# Patient Record
Sex: Female | Born: 1945 | Race: White | Hispanic: No | Marital: Married | Smoking: Never smoker
Health system: Southern US, Community
[De-identification: ages and names within clinical notes are randomized; demographics above are authoritative.]

## PROBLEM LIST (undated history)

## (undated) DIAGNOSIS — M1990 Unspecified osteoarthritis, unspecified site: Secondary | ICD-10-CM

## (undated) DIAGNOSIS — F419 Anxiety disorder, unspecified: Secondary | ICD-10-CM

## (undated) DIAGNOSIS — M47812 Spondylosis without myelopathy or radiculopathy, cervical region: Secondary | ICD-10-CM

## (undated) DIAGNOSIS — F32A Depression, unspecified: Secondary | ICD-10-CM

## (undated) DIAGNOSIS — I10 Essential (primary) hypertension: Secondary | ICD-10-CM

## (undated) HISTORY — DX: Anxiety disorder, unspecified: F41.9

## (undated) HISTORY — DX: Essential (primary) hypertension: I10

## (undated) HISTORY — PX: ABDOMINAL HYSTERECTOMY: SHX81

## (undated) HISTORY — PX: JOINT REPLACEMENT: SHX530

## (undated) HISTORY — PX: HAND SURGERY: SHX662

## (undated) HISTORY — DX: Depression, unspecified: F32.A

---

## 2012-01-07 DIAGNOSIS — M545 Low back pain, unspecified: Secondary | ICD-10-CM | POA: Insufficient documentation

## 2012-01-07 DIAGNOSIS — E669 Obesity, unspecified: Secondary | ICD-10-CM | POA: Insufficient documentation

## 2012-01-07 DIAGNOSIS — G8929 Other chronic pain: Secondary | ICD-10-CM | POA: Insufficient documentation

## 2013-07-07 DIAGNOSIS — M1710 Unilateral primary osteoarthritis, unspecified knee: Secondary | ICD-10-CM | POA: Insufficient documentation

## 2014-01-03 DIAGNOSIS — M47812 Spondylosis without myelopathy or radiculopathy, cervical region: Secondary | ICD-10-CM | POA: Insufficient documentation

## 2016-02-12 DIAGNOSIS — M1811 Unilateral primary osteoarthritis of first carpometacarpal joint, right hand: Secondary | ICD-10-CM | POA: Insufficient documentation

## 2016-05-21 DIAGNOSIS — H02834 Dermatochalasis of left upper eyelid: Secondary | ICD-10-CM | POA: Insufficient documentation

## 2016-05-21 DIAGNOSIS — H02135 Senile ectropion of left lower eyelid: Secondary | ICD-10-CM | POA: Insufficient documentation

## 2016-05-21 DIAGNOSIS — H02132 Senile ectropion of right lower eyelid: Secondary | ICD-10-CM | POA: Insufficient documentation

## 2016-05-21 DIAGNOSIS — H02831 Dermatochalasis of right upper eyelid: Secondary | ICD-10-CM | POA: Insufficient documentation

## 2017-02-05 DIAGNOSIS — Z1211 Encounter for screening for malignant neoplasm of colon: Secondary | ICD-10-CM | POA: Insufficient documentation

## 2017-09-21 DIAGNOSIS — F325 Major depressive disorder, single episode, in full remission: Secondary | ICD-10-CM | POA: Insufficient documentation

## 2018-07-05 DIAGNOSIS — Z96643 Presence of artificial hip joint, bilateral: Secondary | ICD-10-CM | POA: Insufficient documentation

## 2018-07-05 DIAGNOSIS — Z96651 Presence of right artificial knee joint: Secondary | ICD-10-CM | POA: Insufficient documentation

## 2018-07-05 DIAGNOSIS — Z96641 Presence of right artificial hip joint: Secondary | ICD-10-CM | POA: Insufficient documentation

## 2018-07-05 DIAGNOSIS — Z96642 Presence of left artificial hip joint: Secondary | ICD-10-CM | POA: Insufficient documentation

## 2018-07-05 DIAGNOSIS — Z96653 Presence of artificial knee joint, bilateral: Secondary | ICD-10-CM | POA: Insufficient documentation

## 2018-07-05 DIAGNOSIS — Z09 Encounter for follow-up examination after completed treatment for conditions other than malignant neoplasm: Secondary | ICD-10-CM | POA: Insufficient documentation

## 2018-07-09 DIAGNOSIS — M47816 Spondylosis without myelopathy or radiculopathy, lumbar region: Secondary | ICD-10-CM | POA: Insufficient documentation

## 2018-12-29 DIAGNOSIS — M1990 Unspecified osteoarthritis, unspecified site: Secondary | ICD-10-CM | POA: Insufficient documentation

## 2019-06-07 DIAGNOSIS — M1712 Unilateral primary osteoarthritis, left knee: Secondary | ICD-10-CM | POA: Insufficient documentation

## 2020-04-05 DIAGNOSIS — Z6839 Body mass index (BMI) 39.0-39.9, adult: Secondary | ICD-10-CM | POA: Insufficient documentation

## 2020-05-16 DIAGNOSIS — M4807 Spinal stenosis, lumbosacral region: Secondary | ICD-10-CM | POA: Insufficient documentation

## 2020-06-07 DIAGNOSIS — G8929 Other chronic pain: Secondary | ICD-10-CM | POA: Insufficient documentation

## 2020-07-24 DIAGNOSIS — F10982 Alcohol use, unspecified with alcohol-induced sleep disorder: Secondary | ICD-10-CM | POA: Insufficient documentation

## 2020-07-24 DIAGNOSIS — F4542 Pain disorder with related psychological factors: Secondary | ICD-10-CM | POA: Insufficient documentation

## 2021-01-08 DIAGNOSIS — R519 Headache, unspecified: Secondary | ICD-10-CM | POA: Diagnosis present

## 2021-01-08 DIAGNOSIS — E860 Dehydration: Secondary | ICD-10-CM | POA: Insufficient documentation

## 2021-01-08 DIAGNOSIS — U071 COVID-19: Secondary | ICD-10-CM | POA: Insufficient documentation

## 2021-01-08 DIAGNOSIS — R55 Syncope and collapse: Secondary | ICD-10-CM | POA: Insufficient documentation

## 2021-01-08 NOTE — ED Triage Notes (Signed)
Pt in with co syncopal episode tonight hx of the same in the past 2 weeks. Family has recently tested positive for covid. Also had n.v.d worsened today.

## 2021-01-09 ENCOUNTER — Emergency Department
Admission: EM | Admit: 2021-01-09 | Discharge: 2021-01-09 | Disposition: A | Discharge status: Home / Self Care | Payer: Medicare HMO | Attending: Emergency Medicine | Admitting: Emergency Medicine

## 2021-01-09 ENCOUNTER — Emergency Department: Payer: Medicare HMO

## 2021-01-09 ENCOUNTER — Other Ambulatory Visit: Payer: Self-pay

## 2021-01-09 DIAGNOSIS — E860 Dehydration: Secondary | ICD-10-CM

## 2021-01-09 DIAGNOSIS — R55 Syncope and collapse: Secondary | ICD-10-CM

## 2021-01-09 DIAGNOSIS — U071 COVID-19: Secondary | ICD-10-CM

## 2021-01-09 LAB — COMPREHENSIVE METABOLIC PANEL
ALT: 14 U/L (ref 0–44)
AST: 19 U/L (ref 15–41)
Albumin: 4 g/dL (ref 3.5–5.0)
Alkaline Phosphatase: 84 U/L (ref 38–126)
Anion gap: 12 (ref 5–15)
BUN: 29 mg/dL — ABNORMAL HIGH (ref 8–23)
CO2: 23 mmol/L (ref 22–32)
Calcium: 8.9 mg/dL (ref 8.9–10.3)
Chloride: 101 mmol/L (ref 98–111)
Creatinine, Ser: 1.6 mg/dL — ABNORMAL HIGH (ref 0.44–1.00)
GFR, Estimated: 33 mL/min — ABNORMAL LOW (ref >60)
Glucose, Bld: 118 mg/dL — ABNORMAL HIGH (ref 70–99)
Potassium: 3.9 mmol/L (ref 3.5–5.1)
Sodium: 136 mmol/L (ref 135–145)
Total Bilirubin: 1.1 mg/dL (ref 0.3–1.2)
Total Protein: 7.4 g/dL (ref 6.5–8.1)

## 2021-01-09 LAB — TROPONIN I (HIGH SENSITIVITY)
Troponin I (High Sensitivity): 5 ng/L (ref <18)
Troponin I (High Sensitivity): 6 ng/L (ref <18)

## 2021-01-09 LAB — CBC
HCT: 40.5 % (ref 36.0–46.0)
Hemoglobin: 13.3 g/dL (ref 12.0–15.0)
MCH: 30 pg (ref 26.0–34.0)
MCHC: 32.8 g/dL (ref 30.0–36.0)
MCV: 91.2 fL (ref 80.0–100.0)
Platelets: 176 10*3/uL (ref 150–400)
RBC: 4.44 MIL/uL (ref 3.87–5.11)
RDW: 12.2 % (ref 11.5–15.5)
WBC: 6.8 10*3/uL (ref 4.0–10.5)
nRBC: 0 % (ref 0.0–0.2)

## 2021-01-09 LAB — RESP PANEL BY RT-PCR (FLU A&B, COVID) ARPGX2
Influenza A by PCR: NEGATIVE
Influenza B by PCR: NEGATIVE
SARS Coronavirus 2 by RT PCR: POSITIVE — AB

## 2021-01-09 MED ORDER — ONDANSETRON 4 MG PO TBDP: 4.0000 mg | ORAL_TABLET | Freq: Three times a day (TID) | ORAL | 0 refills | Status: DC | PRN

## 2021-01-09 MED ORDER — LACTATED RINGERS IV BOLUS
1000.0000 mL | Freq: Once | INTRAVENOUS | Status: AC
  Administered 2021-01-09: 1000 mL via INTRAVENOUS

## 2021-01-09 NOTE — ED Provider Notes (Signed)
[facility] Emergency Department Provider Note   ____________________________________________   Event Date/Time   First MD Initiated Contact with Patient 01/09/21 [phone]     (approximate)  I have reviewed the triage vital signs and the nursing notes.   HISTORY  Chief Complaint Loss of Consciousness    HPI Grace Zimmerman is a 75 y.o. female with a stated past recent diagnosis of COVID who presents after a syncopal episode that was witnessed earlier tonight.  Patient states she has had similar episodes in the past 2 weeks that she believes is secondary to nausea/vomiting/diarrhea has worsened beginning yesterday.  Patient denies any exacerbating or relieving factors.  Patient states that she feels significantly lightheaded when rising from a seated position or getting up from laying down.         No past medical history on file.  There are no problems to display for this patient.   Prior to Admission medications   Medication Sig Start Date End Date Taking? Authorizing Provider  ondansetron (ZOFRAN ODT) 4 MG disintegrating tablet Take 1 tablet (4 mg total) by mouth every 8 (eight) hours as needed for nausea or vomiting. 01/09/21  Yes Naaman Plummer, MD    Allergies Patient has no allergy information on record.  No family history on file.  Social History    Review of Systems Constitutional: No fever/chills Eyes: No visual changes. ENT: No sore throat. Cardiovascular: Denies chest pain. Respiratory: Denies shortness of breath. Gastrointestinal: No abdominal pain.  Endorses nausea/vomiting/diarrhea Genitourinary: Negative for dysuria. Musculoskeletal: Negative for acute arthralgias Skin: Negative for rash. Neurological: Negative for headaches, weakness/numbness/paresthesias in any extremity Psychiatric: Negative for suicidal ideation/homicidal ideation   ____________________________________________   PHYSICAL EXAM:  VITAL SIGNS: ED  Triage Vitals  Enc Vitals Group     BP 01/08/21 2358 (!) 109/56     Pulse Rate 01/08/21 2358 68     Resp 01/08/21 2358 20     Temp 01/08/21 2358 98.1 F (36.7 C)     Temp Source 01/08/21 2358 Oral     SpO2 01/08/21 2358 96 %     Weight 01/09/21 0001 223 lb (101.2 kg)     Height 01/09/21 0001 5\' 7"  (1.702 m)     Head Circumference --      Peak Flow --      Pain Score 01/09/21 0001 0     Pain Loc --      Pain Edu? --      Excl. in [place]? --    Constitutional: Alert and oriented. Well appearing and in no acute distress. Eyes: Conjunctivae are normal. PERRL. Head: Atraumatic. Nose: No congestion/rhinnorhea. Mouth/Throat: Mucous membranes are moist. Neck: No stridor Cardiovascular: Grossly normal heart sounds.  Good peripheral circulation. Respiratory: Normal respiratory effort.  No retractions. Gastrointestinal: Soft and nontender. No distention. Musculoskeletal: No obvious deformities Neurologic:  Normal speech and language. No gross focal neurologic deficits are appreciated. Skin:  Skin is warm and dry. No rash noted. Psychiatric: Mood and affect are normal. Speech and behavior are normal.  ____________________________________________   LABS (all labs ordered are listed, but only abnormal results are displayed)  Labs Reviewed  RESP PANEL BY RT-PCR (FLU A&B, COVID) ARPGX2 - Abnormal; Notable for the following components:      Result Value   SARS Coronavirus 2 by RT PCR POSITIVE (*)    All other components within normal limits  COMPREHENSIVE METABOLIC PANEL - Abnormal; Notable for the following components:   Glucose, Bld  118 (*)    BUN 29 (*)    Creatinine, Ser 1.60 (*)    GFR, Estimated 33 (*)    All other components within normal limits  CBC  URINALYSIS, COMPLETE (UACMP) WITH MICROSCOPIC  TROPONIN I (HIGH SENSITIVITY)  TROPONIN I (HIGH SENSITIVITY)   ____________________________________________  EKG  ED ECG REPORT I, Naaman Plummer, the attending physician,  personally viewed and interpreted this ECG.  Date: 01/09/2021 EKG Time: 0346 Rate: 76 Rhythm: normal sinus rhythm QRS Axis: normal Intervals: normal ST/T Wave abnormalities: normal Narrative Interpretation: no evidence of acute ischemia  ____________________________________________  RADIOLOGY  ED MD interpretation: Single view portable x-ray of the chest shows no evidence of acute abnormalities including no pneumonia, pneumothorax, or widened mediastinum  Official radiology report(s): DG Chest 1 View  Result Date: 01/09/2021 CLINICAL DATA:  Syncope EXAM: CHEST  1 VIEW COMPARISON:  None. FINDINGS: The heart size and mediastinal contours are within normal limits. Both lungs are clear. The visualized skeletal structures are unremarkable. IMPRESSION: No active disease. Electronically Signed   By: Constance Holster M.D.   On: 01/09/2021 00:45    ____________________________________________   PROCEDURES  Procedure(s) performed (including Critical Care):  Procedures   ____________________________________________   INITIAL IMPRESSION / ASSESSMENT AND PLAN / ED COURSE  As part of my medical decision making, I reviewed the following data within the [place] notes reviewed and incorporated, Labs reviewed, EKG interpreted, Old chart reviewed, Radiograph reviewed and Notes from prior ED visits reviewed and incorporated        Patient presents with complaints of syncope/presyncope ED Workup:  CBC, BMP, Troponin, BNP, ECG, CXR Differential diagnosis includes HF, ICH, seizure, stroke, HOCM, ACS, aortic dissection, malignant arrhythmia, or GI bleed. Findings: No evidence of acute laboratory abnormalities.  Troponin negative x1 EKG: No e/o STEMI. No evidence of Brugadas sign, delta wave, epsilon wave, significantly prolonged QTc, or malignant arrhythmia.  Disposition: Discharge. Patient is at baseline at this time. Return precautions expressed and  understood in person. Advised follow up with primary care provider or clinic physician in next 24 hours.      ____________________________________________   FINAL CLINICAL IMPRESSION(S) / ED DIAGNOSES  Final diagnoses:  Dehydration  Syncope and collapse  COVID-19 virus infection     ED Discharge Orders         Ordered    ondansetron (ZOFRAN ODT) 4 MG disintegrating tablet  Every 8 hours PRN        01/09/21 0939           Note:  This document was prepared using Dragon voice recognition software and may include unintentional dictation errors.   Naaman Plummer, MD 01/09/21 [phone]

## 2021-01-09 NOTE — ED Notes (Signed)
See triage note, pt reports she thinks she has covid, sx started 2 weeks ago. C/o N/V NAD noted, RR even and unlabored

## 2021-01-09 NOTE — ED Notes (Signed)
Pt provided graham crackers and ice water for PO challenge

## 2021-01-09 NOTE — ED Notes (Signed)
Dr bradler notified of positive covid, no new orders at this time

## 2021-01-10 ENCOUNTER — Telehealth: Payer: Self-pay | Admitting: *Deleted

## 2021-01-10 NOTE — Telephone Encounter (Signed)
Called to discuss with patient about COVID-19 symptoms and the use of one of the available treatments for those with mild to moderate Covid symptoms and at a high risk of hospitalization.  Pt appears to qualify for outpatient treatment due to co-morbid conditions and/or a member of an at-risk group in accordance with the FDA Emergency Use Authorization.    Symptom onset:  Vaccinated:  Booster?  Immunocompromised?  Qualifiers:   Unable to reach pt - No answer and unable to leave a VM.  Grace Zimmerman

## 2021-02-26 DIAGNOSIS — Z0000 Encounter for general adult medical examination without abnormal findings: Secondary | ICD-10-CM | POA: Insufficient documentation

## 2021-03-12 DIAGNOSIS — F419 Anxiety disorder, unspecified: Secondary | ICD-10-CM | POA: Insufficient documentation

## 2021-03-12 DIAGNOSIS — K219 Gastro-esophageal reflux disease without esophagitis: Secondary | ICD-10-CM | POA: Insufficient documentation

## 2021-03-12 DIAGNOSIS — E785 Hyperlipidemia, unspecified: Secondary | ICD-10-CM | POA: Insufficient documentation

## 2021-03-12 DIAGNOSIS — I10 Essential (primary) hypertension: Secondary | ICD-10-CM | POA: Insufficient documentation

## 2021-03-12 NOTE — Progress Notes (Signed)
Patient: Grace Zimmerman  Service Category: E/M  Provider: Gaspar Cola, MD  DOB: [birth year]  DOS: 03/13/2021  Referring Provider: Chana Bode, PA  MRN: [id number]  Setting: Ambulatory outpatient  PCP: Kirk Ruths, MD  Type: New Patient  Specialty: Interventional Pain Management    Location: Office  Delivery: Face-to-face     Primary Reason(s) for Visit: Encounter for initial evaluation of one or more chronic problems (new to examiner) potentially causing chronic pain, and posing a threat to normal musculoskeletal function. (Level of risk: High) CC: Back Pain (Lumbar bilateral ), Hip Pain (Bilateral ), and Shoulder Pain (Bilateral )  HPI  [name] is a 75 y.o. year old, female patient, who comes for the first time to our practice referred by Chana Bode, PA for our initial evaluation of her chronic pain. 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[place]); Low back pain potentially associated with radiculopathy; DJD (degenerative joint disease) of cervical spine; Lumbar spondylosis; Major depression in remission ([place]); BMI 39.0-39.9,adult; Obesity; Other chronic pain; 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; Osteoarthritis; Osteoarthritis of carpometacarpal joint of right thumb; Primary osteoarthritis of left knee; Healthcare maintenance; Screening for colorectal cancer; Senile ectropion of left lower eyelid; Senile ectropion of right lower eyelid; Spinal stenosis of lumbosacral region; Chronic pain syndrome; Pharmacologic therapy; Disorder of skeletal system; Problems influencing  health status; Abnormal MRI, lumbar spine (05/31/2020); Lumbar postlaminectomy syndrome; Lumbar Grade 1 Anterolisthesis of L3/L4 and L4/L5; Lumbosacral Grade 1 Anterolisthesis of L5/S1; Lumbar Grade 1 Retrolisthesis of L2/L3; Lumbar facet hypertrophy (Multilevel) (Bilateral); Lumbar central spinal stenosis, 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); Lumbar lateral recess stenosis (Bilateral: L2-3) (Right: L3-4); Lumbar foraminal stenosis (Right: L2-3 L4-5) (Left: L5-S1) (Bilateral: L3-4); Annular tear of lumbar disc (Left: L4-5); Lumbosacral facet syndrome; DDD (degenerative disc disease), lumbosacral; Abnormal x-ray of lumbar spine (05/31/2020); History of TKR (total knee replacement) (Left); and Failed spinal cord stimulator on their problem list. Today she comes in for evaluation of her Back Pain (Lumbar bilateral ), Hip Pain (Bilateral ), and Shoulder Pain (Bilateral )  Pain Assessment: Location: Lower,Left,Right Back (hips and shoulders) Radiating: possible back into hips Onset: More than a month ago Duration: Chronic pain Quality: Discomfort,Constant Severity: 9 /10 (subjective, self-reported pain score)  Effect on ADL: has to prop up to cook and wash dishes.  difficult to grocery shop Timing: Constant Modifying factors: nothing currently BP: (!) 158/72  HR: 72  Onset and Duration: Gradual and Present longer than 3 months Cause of pain: Unknown Severity: Getting worse, NAS-11 at its worse: 8/10, NAS-11 at its best: 0/10, NAS-11 now: 5/10 and NAS-11 on the average: 5/10 Timing: Not influenced by the time of the day, After activity or exercise and After a period of immobility Aggravating Factors: Kneeling, Motion, Prolonged standing and Walking Alleviating Factors: Cold packs, Nerve blocks, Resting, Sitting and Sleeping Associated Problems: Night-time cramps, Inability to concentrate, Swelling and Pain that wakes patient up Quality of Pain: Agonizing, Annoying, Disabling and Exhausting Previous  Examinations or Tests: MRI scan, Nerve block and Psychiatric evaluation Previous Treatments: Epidural steroid injections, Narcotic medications and Spinal cord stimulator  Patient followed by [facility], but patient has moved and her insurance requires switching  to a new provider.   [facility] Nada Libman, PA-C and Shirlyn Goltz, MD) Procedures done by Shirlyn Goltz, MD: Right L3-5 medial branch RFA (12/06/2020)  Right L4, L5, S1 MBB #2 (11/12/2020) Right L3, L4, L5 MBB #1 (10/10/2020) Neurosurgeon: Grace Perla, MD and Grace Khan, MD  According to the patient her primary area of pain is that of the lower back (Bilateral) (R>L).  She has had this problem for many years and she used to be a patient of Dr. Nila Nephew, who not only did a great deal of interventional therapies this patient, but he also did on 02/01/2015 a right-sided hemilaminectomy at L4-5 and L5-S1 with microdiscectomy and posterior decompression with foraminotomy and facetectomy at both level.  The patient also indicates having had a spinal cord stimulator implant which did not work and was removed.  (Surprisingly, the patient did not remember that she had back surgery done.)  Procedures done by Grace Khan, MD: Bilateral L4 TFESI (07/18/2013) Bilateral L5 TFESI (07/18/2013) Bilateral L4 TFESI (04/03/2014) Bilateral L5 TFESI (04/03/2014) Bilateral L4 TFESI (05/29/2014) Bilateral L5 TFESI (05/29/2014) Bilateral L4 TFESI (12/04/2014) Bilateral L5 TFESI (12/04/2014) Right L4-5 and L5-S1 posterior hemilaminectomy with facetectomy and foraminotomy (02/01/2015) Bilateral L3-4 facet joint injection (08/02/2018) Bilateral L3-4 facet joint injection (11/08/2018)  She also describes having had physical therapy and a recent lumbar MRI as well as flexion-extension views of the lumbar spine.  These revealed:  (05/31/2020) LUMBAR MRI FINDINGS:  Grade 1 Anterolisthesis of L3 on L4, L4 on L5, and L5 on  S1 and trace Retrolisthesis of L2 on L3. There are mild endplate reactive changes at L3-L4 and L5-S1. No abnormal enhancement.   LEVELS: L1-2: Mild-moderate disc desiccation with small circumferential disc osteophyte complex, unchanged. No canal or foraminal stenosis.  L2-3: Mild disc desiccation with small right eccentric circumferential disc-osteophyte complex, unchanged. Together with bilateral facet hypertrophy, this results in unchanged narrowing of the bilateral lateral recesses as well as increased mild-moderate right foraminal narrowing.  L3-4: Mild disc desiccation with increased small circumferential disc bulge/pseudodisc. Together with severe bilateral facet hypertrophy and posterior epidural fat, this results in slightly increased moderate central canal stenosis and right lateral recess stenosis as well as unchanged mild bilateral foraminal narrowing.  L4-5: Postsurgical changes status post right hemilaminotomy. Increased moderate circumferential disc bulge/pseudodisc with left foraminal annular tear. Together with moderate bilateral facet hypertrophy, this results in increased mild right foraminal narrowing. No canal stenosis.  L5-S1: Postsurgical changes status post right hemilaminotomy. Small right eccentric circumferential disc bulge abuts the descending right S1 nerve roots. The large synovial cysts arising from the right facet joint seen on the prior exam have been resected. A tiny synovial cyst remains (series 9, image 32). Slightly increased mild left foraminal narrowing. No central canal stenosis or right foraminal narrowing.  Sacroiliac joints: Bilateral degenerative disease.  Soft tissues: There is soft tissueedema adjacent to the left L4-L5 and L5-S1 facet joints. Soft tissue edema adjacent to the right facet joints is likely postsurgical. Colonic diverticulosis.   IMPRESSION:  1. Slightly increased moderate central canal and right lateral recess stenosis at L3-4.  2. Increased  mild-moderate right foraminal narrowing at L2-L3 and L4-L5 and slightly increased left foraminal narrowing at L5-S1.  3. A small right eccentric circumferential disc bulge abuts the descending right S1 nerve roots at L5-S1. However, the degree of stenosis has significantly improved status post hemilaminotomy.  (05/31/2020) LUMBAR X-RAYS FINDINGS:  Degenerative facet changes throughout the lumbar spine.  There is grade 1 anterolisthesis of L4 on L5. This anterolisthesis is essentially stable between neutral and extension views but appears to slightly increase in degree on flexion views some of this appearance may be related to differences in angulation of the vertebral bodies due to positioning, however. Some degree of instability cannot be excluded. There is grade 1 anterolisthesis of L3 on L4 which is stable between flexion and extension. There is minimal grade 1 retrolisthesis of L2 on L3 which is also stable flexion and extension. There is disc space narrowing at all levels of the lumbar spine.There are bilateral hip arthroplasties noted. There are endplate osteophytes at all levels of the lumbar spine.   IMPRESSION:  1. Multilevel degenerative disc disease and facet arthropathy throughout the lumbar spine with worsening disc space narrowing as compared to the prior studies.  2. Grade 1 anterolisthesis of L4 on L5 which appears slightly increased on flexion views relative to extension and neutral lateral views, suggestive of possible instability.  3. Minimal grade 1 anterolisthesis of L3 on L4 and minimal grade 1 retrolisthesis of L2 on L3 which are both stable between flexion and extension.  The patient secondary area of pain is that of the shoulders (Bilateral) (L>R), as well as upper back between the shoulder blades.  The patient denies any surgeries, physical therapy, or nerve blocks.  The patient's third area pain is that of the lower extremities (Bilateral) (R>L), with a hip being worse than the  knee.  The fourth area of pain is that of the hips (Bilateral) (R>L).  She does have a history of a bilateral hip arthroplasty.  The patient's fifth area pain is that of the knee (Left).  He describes the pain in the medial aspect of the knee and she does have a history of bilateral total knee replacements.  The patient indicates having generalized weakness in all of her joints.  Today she comes in extremely anxious due to the fact that she has already ran out of her pain medication and is trying to have somebody write for it, but did not realize the extent of the process required to evaluate and justify the need for a controlled substance.  Today I took the time to provide the patient with information regarding my pain practice. The patient was informed that my practice is divided into two sections: an interventional pain management section, as well as a completely separate and distinct medication management section. I explained that I have procedure days for my interventional therapies, and evaluation days for follow-ups and medication management. Because of the amount of documentation required during both, they are kept separated. This means that there is the possibility that she may be scheduled for a procedure on one day, and medication management the next. I have also informed her that because of staffing and facility limitations, I no longer take patients for medication management only. To illustrate the reasons for this, I gave the patient the example of surgeons, and how inappropriate it would be to refer a patient to his/her care, just to write for the post-surgical antibiotics on a surgery done by a different surgeon.   Because interventional pain management is my board-certified specialty, the patient was informed that joining my practice means that they are open to any and all interventional therapies. I made it clear that this does not mean that they will be forced to have any procedures  done. What this means is that I believe interventional therapies to be essential part of  the diagnosis and proper management of chronic pain conditions. Therefore, patients not interested in these interventional alternatives will be better served under the care of a different practitioner.  The patient was also made aware of my Comprehensive Pain Management Safety Guidelines where by joining my practice, they limit all of their nerve blocks and joint injections to those done by our practice, for as long as we are retained to manage their care.   Historic Controlled Substance Pharmacotherapy Review  PMP and historical list of controlled substances: Oxycodone IR 5 mg, 1 tab p.o. 4 times daily; hydrocodone/APAP 10/325 1 tab p.o. twice daily; pregabalin 75 mg capsule 1 tab p.o. twice daily; tramadol 50 mg 1 tab p.o. 4 times daily; Tylenol #3 (APAP-codeine #3) 1 tab p.o. 3 times daily. Current opioid analgesics: None MME/day: 0 mg/day  Historical Monitoring: The patient  reports no history of drug use. List of all UDS Test(s): No results found for: MDMA, COCAINSCRNUR, [place], [place], CANNABQUANT, THCU, [place] List of other Serum/Urine Drug Screening Test(s):  No results found for: AMPHSCRSER, BARBSCRSER, BENZOSCRSER, COCAINSCRSER, COCAINSCRNUR, PCPSCRSER, PCPQUANT, THCSCRSER, THCU, CANNABQUANT, OPIATESCRSER, OXYSCRSER, PROPOXSCRSER, ETH Historical Background Evaluation: [place] PMP: PDMP reviewed during this encounter. Online review of the past 62-monthperiod conducted.             PMP NARX Score Report:  Narcotic: 300 Sedative: 200 Stimulant: 000 [place] Department of public safety, offender search: (Editor, commissioningInformation) Non-contributory Risk Assessment Profile: Aberrant behavior: None observed or detected today Risk factors for fatal opioid overdose: None identified today PMP NARX Overdose Risk Score: 510 Fatal overdose hazard ratio (HR): Calculation deferred Non-fatal overdose hazard ratio (HR):  Calculation deferred Risk of opioid abuse or dependence: 0.7-3.0% with doses ? 36 MME/day and 6.1-26% with doses ? 120 MME/day. Substance use disorder (SUD) risk level: See below Personal History of Substance Abuse (SUD-Substance use disorder):  Alcohol: Negative  Illegal Drugs: Negative  Rx Drugs: Negative  ORT Risk Level calculation: Moderate Risk  Opioid Risk Tool - 03/13/21 1345      Family History of Substance Abuse   Alcohol Positive Female    Illegal Drugs Negative    Rx Drugs Negative      Personal History of Substance Abuse   Alcohol Negative    Illegal Drugs Negative    Rx Drugs Negative      Psychological Disease   Psychological Disease Positive    ADD Negative    OCD Negative    Bipolar Negative    Schizophrenia Negative    Depression Positive      Total Score   Opioid Risk Tool Scoring 4    Opioid Risk Interpretation Moderate Risk          ORT Scoring interpretation table:  Score <3 = Low Risk for SUD  Score between 4-7 = Moderate Risk for SUD  Score >8 = High Risk for Opioid Abuse   PHQ-2 Depression Scale:  Total score:    PHQ-2 Scoring interpretation table: (Score and probability of major depressive disorder)  Score 0 = No depression  Score 1 = 15.4% Probability  Score 2 = 21.1% Probability  Score 3 = 38.4% Probability  Score 4 = 45.5% Probability  Score 5 = 56.4% Probability  Score 6 = 78.6% Probability   PHQ-9 Depression Scale:  Total score:    PHQ-9 Scoring interpretation table:  Score 0-4 = No depression  Score 5-9 = Mild depression  Score 10-14 = Moderate depression  Score 15-19 = Moderately severe  depression  Score 20-27 = Severe depression (2.4 times higher risk of SUD and 2.89 times higher risk of overuse)   Pharmacologic Plan: As per protocol, I have not taken over any controlled substance management, pending the results of ordered tests and/or consults.            Initial impression: Pending review of available data and ordered  tests.  Meds   Current Outpatient Medications:  .  amoxicillin (AMOXIL) 500 MG capsule, Take 500 mg by mouth as needed. Prior to dental work, Disp: , Rfl:  .  busPIRone (BUSPAR) 10 MG tablet, Take 20 mg by mouth in the morning, at noon, and at bedtime., Disp: , Rfl:  .  escitalopram (LEXAPRO) 20 MG tablet, Take 20 mg by mouth daily., Disp: , Rfl:  .  naloxone (NARCAN) nasal spray 4 mg/0.1 mL, Place 4 mg into the nose as needed., Disp: , Rfl:  .  olmesartan (BENICAR) 40 MG tablet, Take 40 mg by mouth daily., Disp: , Rfl:  .  pantoprazole (PROTONIX) 40 MG tablet, Take 1 tablet by mouth daily., Disp: , Rfl:  .  traZODone (DESYREL) 150 MG tablet, Take 300 mg by mouth at bedtime., Disp: , Rfl:  .  ondansetron (ZOFRAN ODT) 4 MG disintegrating tablet, Take 1 tablet (4 mg total) by mouth every 8 (eight) hours as needed for nausea or vomiting. (Patient not taking: Reported on 03/13/2021), Disp: 20 tablet, Rfl: 0  Imaging Review   Complexity Note: No results found under the [employer] electronic medical record.                        ROS  Cardiovascular: High blood pressure Pulmonary or Respiratory: No reported pulmonary signs or symptoms such as wheezing and difficulty taking a deep full breath (Asthma), difficulty blowing air out (Emphysema), coughing up mucus (Bronchitis), persistent dry cough, or temporary stoppage of breathing during sleep Neurological: No reported neurological signs or symptoms such as seizures, abnormal skin sensations, urinary and/or fecal incontinence, being born with an abnormal open spine and/or a tethered spinal cord Psychological-Psychiatric: Psychiatric disorder, Anxiousness and Depressed Gastrointestinal: No reported gastrointestinal signs or symptoms such as vomiting or evacuating blood, reflux, heartburn, alternating episodes of diarrhea and constipation, inflamed or scarred liver, or pancreas or irrregular and/or infrequent bowel movements Genitourinary: No  reported renal or genitourinary signs or symptoms such as difficulty voiding or producing urine, peeing blood, non-functioning kidney, kidney stones, difficulty emptying the bladder, difficulty controlling the flow of urine, or chronic kidney disease Hematological: No reported hematological signs or symptoms such as prolonged bleeding, low or poor functioning platelets, bruising or bleeding easily, hereditary bleeding problems, low energy levels due to low hemoglobin or being anemic Endocrine: No reported endocrine signs or symptoms such as high or low blood sugar, rapid heart rate due to high thyroid levels, obesity or weight gain due to slow thyroid or thyroid disease Rheumatologic: No reported rheumatological signs and symptoms such as fatigue, joint pain, tenderness, swelling, redness, heat, stiffness, decreased range of motion, with or without associated rash Musculoskeletal: Negative for myasthenia gravis, muscular dystrophy, multiple sclerosis or malignant hyperthermia Work History: Retired  Allergies  Ms. Deford is allergic to latex.  Laboratory Chemistry Profile   Renal Lab Results  Component Value Date   BUN 29 (H) 01/09/2021   CREATININE 1.60 (H) 01/09/2021   GFRNONAA 33 (L) 01/09/2021     Electrolytes Lab Results  Component Value Date   NA 136  01/09/2021   K 3.9 01/09/2021   CL 101 01/09/2021   CALCIUM 8.9 01/09/2021   MG 2.6 (H) 03/13/2021     Hepatic Lab Results  Component Value Date   AST 19 01/09/2021   ALT 14 01/09/2021   ALBUMIN 4.0 01/09/2021   ALKPHOS 84 01/09/2021     ID Lab Results  Component Value Date   SARSCOV2NAA POSITIVE (A) 01/09/2021     Bone No results found for: [place], [id number], EG3151VO1, [id number], 25OHVITD1, 25OHVITD2, 25OHVITD3, TESTOFREE, TESTOSTERONE   Endocrine Lab Results  Component Value Date   GLUCOSE 118 (H) 01/09/2021     Neuropathy No results found for: VITAMINB12, FOLATE, HGBA1C, HIV   CNS No results found for:  COLORCSF, APPEARCSF, RBCCOUNTCSF, WBCCSF, POLYSCSF, LYMPHSCSF, EOSCSF, PROTEINCSF, GLUCCSF, JCVIRUS, CSFOLI, IGGCSF, LABACHR, ACETBL, LABACHR, ACETBL   Inflammation (CRP: Acute  ESR: Chronic) Lab Results  Component Value Date   ESRSEDRATE 28 03/13/2021     Rheumatology No results found for: RF, ANA, LABURIC, URICUR, LYMEIGGIGMAB, LYMEABIGMQN, HLAB27   Coagulation Lab Results  Component Value Date   PLT 176 01/09/2021     Cardiovascular Lab Results  Component Value Date   HGB 13.3 01/09/2021   HCT 40.5 01/09/2021     Screening Lab Results  Component Value Date   SARSCOV2NAA POSITIVE (A) 01/09/2021     Cancer No results found for: CEA, CA125, LABCA2   Allergens No results found for: ALMOND, APPLE, ASPARAGUS, AVOCADO, BANANA, BARLEY, BASIL, BAYLEAF, GREENBEAN, LIMABEAN, WHITEBEAN, BEEFIGE, REDBEET, BLUEBERRY, BROCCOLI, CABBAGE, MELON, CARROT, CASEIN, CASHEWNUT, CAULIFLOWER, CELERY     Note: Lab results reviewed.  PFSH  Drug: Ms. Lundblad  reports no history of drug use. Alcohol:  reports previous alcohol use. Tobacco:  reports that she has never smoked. She has never used smokeless tobacco. Medical:  has a past medical history of Anxiety, Depression, and Hypertension. Family: family history is not on file.  Past Surgical History:  Procedure Laterality Date  . ABDOMINAL HYSTERECTOMY    . HAND SURGERY Bilateral    CTS  . JOINT REPLACEMENT     bilateral knee, bilateral hip    Active Ambulatory Problems    Diagnosis Date Noted  . Anxiety disorder 03/12/2021  . Arthritis of knee 07/07/2013  . Dermatochalasis of left upper eyelid 05/21/2016  . Dermatochalasis of right upper eyelid 05/21/2016  . Essential hypertension 03/12/2021  . Follow-up examination after orthopedic surgery 07/05/2018  . GERD (gastroesophageal reflux disease) 03/12/2021  . History of THR (total hip replacement) (Left) 07/05/2018  . History of THR (total hip replacement) (Right) 07/05/2018  .  History of TKR (total knee replacement) (Right) 07/05/2018  . Hyperlipidemia 03/12/2021  . 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 Low back pain potentially associated with radiculopathy 01/07/2012  . DJD (degenerative joint disease) of cervical spine 01/03/2014  . Lumbar spondylosis 07/09/2018  . Major depression in remission ([place]) 09/21/2017  . BMI 39.0-39.9,adult 04/05/2020  . Obesity 01/07/2012  . Other chronic pain 06/07/2020  . 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 Osteoarthritis 12/29/2018  . Osteoarthritis of carpometacarpal joint of right thumb 02/12/2016  . Primary osteoarthritis of left knee 06/07/2019  . Healthcare maintenance 02/26/2021  . Screening for colorectal cancer 02/05/2017  . Senile ectropion of left lower eyelid 05/21/2016  . Senile ectropion of right lower eyelid 05/21/2016  . Spinal stenosis of lumbosacral region 05/16/2020  . Chronic pain syndrome 03/13/2021  . Pharmacologic therapy 03/13/2021  . Disorder of skeletal  system 03/13/2021  . Problems influencing health status 03/13/2021  . Abnormal MRI, lumbar spine (05/31/2020) 03/13/2021  . Lumbar postlaminectomy syndrome 03/13/2021  . Lumbar Grade 1 Anterolisthesis of L3/L4 and L4/L5 03/13/2021  . Lumbosacral Grade 1 Anterolisthesis of L5/S1 03/13/2021  . Lumbar Grade 1 Retrolisthesis of L2/L3 03/13/2021  . Lumbar facet hypertrophy (Multilevel) (Bilateral) 03/13/2021  . Lumbar central spinal stenosis, 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) 03/13/2021  . Lumbar lateral recess stenosis (Bilateral: L2-3) (Right: L3-4) 03/13/2021  . Lumbar foraminal stenosis (Right: L2-3 L4-5) (Left: L5-S1) (Bilateral: L3-4) 03/13/2021  . Annular tear of lumbar disc (Left: L4-5) 03/13/2021  . Lumbosacral facet syndrome 03/13/2021  . DDD (degenerative disc disease), lumbosacral 03/13/2021  . Abnormal x-ray of lumbar spine (05/31/2020) 03/13/2021  . History of TKR (total knee replacement)  (Left) 03/13/2021  . Failed spinal cord stimulator 03/13/2021   Resolved Ambulatory Problems    Diagnosis Date Noted  . No Resolved Ambulatory Problems   Past Medical History:  Diagnosis Date  . Anxiety   . Depression   . Hypertension    Constitutional Exam  General appearance: Well nourished, well developed, and well hydrated. In no apparent acute distress Vitals:   03/13/21 1331  BP: (!) 158/72  Pulse: 72  Resp: 16  Temp: (!) 97.1 F (36.2 C)  TempSrc: Temporal  SpO2: 100%  Weight: 223 lb (101.2 kg)  Height: 5' 5"  (1.651 m)   BMI Assessment: Estimated body mass index is 37.11 kg/m as calculated from the following:   Height as of this encounter: 5' 5"  (1.651 m).   Weight as of this encounter: 223 lb (101.2 kg).  BMI interpretation table: BMI level Category Range association with higher incidence of chronic pain  <18 kg/m2 Underweight   18.5-24.9 kg/m2 Ideal body weight   25-29.9 kg/m2 Overweight Increased incidence by 20%  30-34.9 kg/m2 Obese (Class I) Increased incidence by 68%  35-39.9 kg/m2 Severe obesity (Class II) Increased incidence by 136%  >40 kg/m2 Extreme obesity (Class III) Increased incidence by 254%   Patient's current BMI Ideal Body weight  Body mass index is 37.11 kg/m. Ideal body weight: 57 kg (125 lb 10.6 oz) Adjusted ideal body weight: 74.7 kg (164 lb 9.6 oz)   BMI Readings from Last 4 Encounters:  03/13/21 37.11 kg/m  01/09/21 34.93 kg/m   Wt Readings from Last 4 Encounters:  03/13/21 223 lb (101.2 kg)  01/09/21 223 lb (101.2 kg)    Psych/Mental status: Alert, oriented x 3 (person, place, & time)       Eyes: Zimmerman Respiratory: No evidence of acute respiratory distress  Assessment  Primary Diagnosis & Pertinent Problem List: The primary encounter diagnosis was Chronic pain syndrome. 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  Visit Diagnosis (New problems to examiner): 1. Chronic pain syndrome   2. Lumbosacral Grade 1 Anterolisthesis of L5/S1   3. Lumbar Grade 1 Anterolisthesis of L3/L4 and L4/L5   4. Lumbar Grade 1 Retrolisthesis of L2/L3   5. Abnormal MRI, lumbar spine (05/31/2020)   6. Pharmacologic therapy   7. Disorder of skeletal system   8. Problems influencing health status   9. Failed spinal cord stimulator    Plan of Care (Initial workup plan)  Note: Ms. Goodridge was reminded  that as per protocol, today's visit has been an evaluation only. We have not taken over the patient's controlled substance management.  Problem-specific plan: No problem-specific Assessment & Plan notes found for this encounter.   Lab Orders     Compliance Drug Analysis, Ur     Magnesium     Vitamin B12     Sedimentation rate     25-Hydroxy vitamin D Lcms D2+D3     C-reactive protein Imaging Orders  No imaging studies ordered today   Referral Orders  No referral(s) requested today   Procedure Orders    No procedure(s) ordered today   Pharmacotherapy (current): Medications ordered:  No orders of the defined types were placed in this encounter.  Medications administered during this visit: Kathlee A. Hizer had no medications administered during this visit.   Pharmacological management options:  Opioid Analgesics: The patient was informed that there is no guarantee that she would be a candidate for opioid analgesics. The decision will be made following CDC guidelines. This decision will be based on the results of diagnostic studies, as well as Ms. Weatherbee's risk profile.   Membrane stabilizer: To be determined at a later time  Muscle relaxant: To be determined at a later time  NSAID: To be determined at a later time   Other analgesic(s): To be determined at a later time   Interventional management options: Ms. Freedman was informed that there is no guarantee that she would be a candidate for interventional therapies. The decision will be based on the results of diagnostic studies, as well as Ms. Eckley's risk profile.  Procedure(s) under consideration:  Diagnostic bilateral lumbar facet MBB  Diagnostic midline caudal ESI + epidurogram  Diagnostic bilateral IA shoulder joint injection  Diagnostic bilateral suprascapular nerve block  Diagnostic bilateral femoral and obturator nerve block  Diagnostic left genicular nerves block     Provider-requested follow-up: Return in 1 week (on 03/20/2021) for (40 min)(2V), (F2F), (s/p Tests).  Future Appointments  Date Time Provider [place]  03/27/2021  8:20 AM Milinda Pointer, MD [facility] None    Note by: Gaspar Cola, MD Date: 03/13/2021; Time: 5:10 PM

## 2021-03-13 ENCOUNTER — Encounter: Payer: Self-pay | Admitting: Pain Medicine

## 2021-03-13 ENCOUNTER — Ambulatory Visit (HOSPITAL_BASED_OUTPATIENT_CLINIC_OR_DEPARTMENT_OTHER): Payer: Medicare HMO | Admitting: Pain Medicine

## 2021-03-13 ENCOUNTER — Other Ambulatory Visit
Admission: RE | Admit: 2021-03-13 | Discharge: 2021-03-13 | Disposition: A | Discharge status: Home / Self Care | Payer: Medicare HMO | Source: Ambulatory Visit | Attending: Pain Medicine | Admitting: Pain Medicine

## 2021-03-13 ENCOUNTER — Other Ambulatory Visit: Payer: Self-pay

## 2021-03-13 VITALS — BP 158/72 | HR 72 | Temp 97.1°F | Resp 16 | Ht 65.0 in | Wt 223.0 lb

## 2021-03-13 DIAGNOSIS — M899 Disorder of bone, unspecified: Secondary | ICD-10-CM

## 2021-03-13 DIAGNOSIS — M25512 Pain in left shoulder: Secondary | ICD-10-CM

## 2021-03-13 DIAGNOSIS — M4316 Spondylolisthesis, lumbar region: Secondary | ICD-10-CM | POA: Insufficient documentation

## 2021-03-13 DIAGNOSIS — Z79899 Other long term (current) drug therapy: Secondary | ICD-10-CM

## 2021-03-13 DIAGNOSIS — G8929 Other chronic pain: Secondary | ICD-10-CM

## 2021-03-13 DIAGNOSIS — G894 Chronic pain syndrome: Secondary | ICD-10-CM

## 2021-03-13 DIAGNOSIS — Z96652 Presence of left artificial knee joint: Secondary | ICD-10-CM | POA: Insufficient documentation

## 2021-03-13 DIAGNOSIS — Z789 Other specified health status: Secondary | ICD-10-CM

## 2021-03-13 DIAGNOSIS — R937 Abnormal findings on diagnostic imaging of other parts of musculoskeletal system: Secondary | ICD-10-CM | POA: Insufficient documentation

## 2021-03-13 DIAGNOSIS — M47816 Spondylosis without myelopathy or radiculopathy, lumbar region: Secondary | ICD-10-CM | POA: Insufficient documentation

## 2021-03-13 DIAGNOSIS — M4317 Spondylolisthesis, lumbosacral region: Secondary | ICD-10-CM | POA: Insufficient documentation

## 2021-03-13 DIAGNOSIS — M549 Dorsalgia, unspecified: Secondary | ICD-10-CM

## 2021-03-13 DIAGNOSIS — M25511 Pain in right shoulder: Secondary | ICD-10-CM

## 2021-03-13 DIAGNOSIS — M961 Postlaminectomy syndrome, not elsewhere classified: Secondary | ICD-10-CM | POA: Insufficient documentation

## 2021-03-13 DIAGNOSIS — M4310 Spondylolisthesis, site unspecified: Secondary | ICD-10-CM | POA: Insufficient documentation

## 2021-03-13 DIAGNOSIS — M48061 Spinal stenosis, lumbar region without neurogenic claudication: Secondary | ICD-10-CM | POA: Insufficient documentation

## 2021-03-13 DIAGNOSIS — M542 Cervicalgia: Secondary | ICD-10-CM | POA: Diagnosis not present

## 2021-03-13 DIAGNOSIS — M5137 Other intervertebral disc degeneration, lumbosacral region: Secondary | ICD-10-CM | POA: Insufficient documentation

## 2021-03-13 DIAGNOSIS — T85192S Other mechanical complication of implanted electronic neurostimulator (electrode) of spinal cord, sequela: Secondary | ICD-10-CM | POA: Insufficient documentation

## 2021-03-13 DIAGNOSIS — M545 Low back pain, unspecified: Secondary | ICD-10-CM | POA: Diagnosis not present

## 2021-03-13 DIAGNOSIS — M47817 Spondylosis without myelopathy or radiculopathy, lumbosacral region: Secondary | ICD-10-CM | POA: Insufficient documentation

## 2021-03-13 DIAGNOSIS — M5136 Other intervertebral disc degeneration, lumbar region: Secondary | ICD-10-CM | POA: Insufficient documentation

## 2021-03-13 LAB — SEDIMENTATION RATE: Sed Rate: 28 mm/hr (ref 0–30)

## 2021-03-13 LAB — MAGNESIUM: Magnesium: 2.6 mg/dL — ABNORMAL HIGH (ref 1.7–2.4)

## 2021-03-13 LAB — VITAMIN D 25 HYDROXY (VIT D DEFICIENCY, FRACTURES): Vit D, 25-Hydroxy: 14.23 ng/mL — ABNORMAL LOW (ref 30–100)

## 2021-03-13 LAB — VITAMIN B12: Vitamin B-12: 228 pg/mL (ref 180–914)

## 2021-03-13 LAB — C-REACTIVE PROTEIN: CRP: 0.5 mg/dL (ref <1.0)

## 2021-03-13 NOTE — Patient Instructions (Signed)

## 2021-03-19 ENCOUNTER — Encounter: Payer: Self-pay | Admitting: Pain Medicine

## 2021-03-19 ENCOUNTER — Other Ambulatory Visit: Payer: Self-pay

## 2021-03-19 ENCOUNTER — Ambulatory Visit: Payer: Medicare HMO | Attending: Pain Medicine | Admitting: Pain Medicine

## 2021-03-19 VITALS — BP 140/86 | HR 70 | Temp 97.2°F | Ht 66.0 in | Wt 230.0 lb

## 2021-03-19 DIAGNOSIS — Z96643 Presence of artificial hip joint, bilateral: Secondary | ICD-10-CM | POA: Diagnosis present

## 2021-03-19 DIAGNOSIS — M48061 Spinal stenosis, lumbar region without neurogenic claudication: Secondary | ICD-10-CM

## 2021-03-19 DIAGNOSIS — G8928 Other chronic postprocedural pain: Secondary | ICD-10-CM | POA: Insufficient documentation

## 2021-03-19 DIAGNOSIS — M25562 Pain in left knee: Secondary | ICD-10-CM | POA: Insufficient documentation

## 2021-03-19 DIAGNOSIS — Z79899 Other long term (current) drug therapy: Secondary | ICD-10-CM

## 2021-03-19 DIAGNOSIS — G8929 Other chronic pain: Secondary | ICD-10-CM | POA: Diagnosis present

## 2021-03-19 DIAGNOSIS — M25512 Pain in left shoulder: Secondary | ICD-10-CM | POA: Diagnosis present

## 2021-03-19 DIAGNOSIS — M792 Neuralgia and neuritis, unspecified: Secondary | ICD-10-CM

## 2021-03-19 DIAGNOSIS — M4310 Spondylolisthesis, site unspecified: Secondary | ICD-10-CM | POA: Diagnosis present

## 2021-03-19 DIAGNOSIS — M4317 Spondylolisthesis, lumbosacral region: Secondary | ICD-10-CM

## 2021-03-19 DIAGNOSIS — M545 Low back pain, unspecified: Secondary | ICD-10-CM

## 2021-03-19 DIAGNOSIS — M4316 Spondylolisthesis, lumbar region: Secondary | ICD-10-CM | POA: Diagnosis present

## 2021-03-19 DIAGNOSIS — G894 Chronic pain syndrome: Secondary | ICD-10-CM

## 2021-03-19 DIAGNOSIS — M25559 Pain in unspecified hip: Secondary | ICD-10-CM | POA: Diagnosis present

## 2021-03-19 DIAGNOSIS — M25552 Pain in left hip: Secondary | ICD-10-CM | POA: Diagnosis present

## 2021-03-19 DIAGNOSIS — E559 Vitamin D deficiency, unspecified: Secondary | ICD-10-CM | POA: Diagnosis present

## 2021-03-19 DIAGNOSIS — M961 Postlaminectomy syndrome, not elsewhere classified: Secondary | ICD-10-CM | POA: Diagnosis present

## 2021-03-19 DIAGNOSIS — Z96653 Presence of artificial knee joint, bilateral: Secondary | ICD-10-CM | POA: Diagnosis present

## 2021-03-19 DIAGNOSIS — M79605 Pain in left leg: Secondary | ICD-10-CM | POA: Insufficient documentation

## 2021-03-19 DIAGNOSIS — M25561 Pain in right knee: Secondary | ICD-10-CM | POA: Diagnosis present

## 2021-03-19 DIAGNOSIS — M25511 Pain in right shoulder: Secondary | ICD-10-CM | POA: Diagnosis not present

## 2021-03-19 DIAGNOSIS — M79604 Pain in right leg: Secondary | ICD-10-CM | POA: Diagnosis present

## 2021-03-19 DIAGNOSIS — M25551 Pain in right hip: Secondary | ICD-10-CM | POA: Diagnosis present

## 2021-03-19 LAB — COMPLIANCE DRUG ANALYSIS, UR

## 2021-03-19 MED ORDER — OXYCODONE HCL 5 MG PO TABS: 5.0000 mg | ORAL_TABLET | Freq: Two times a day (BID) | ORAL | 0 refills | Status: DC | PRN

## 2021-03-19 MED ORDER — PREGABALIN 25 MG PO CAPS: ORAL_CAPSULE | ORAL | 0 refills | Status: DC

## 2021-03-19 MED ORDER — ERGOCALCIFEROL 1.25 MG (50000 UT) PO CAPS: 50000.0000 [IU] | ORAL_CAPSULE | ORAL | 0 refills | Status: AC

## 2021-03-19 NOTE — Progress Notes (Signed)
Safety precautions to be maintained throughout the outpatient stay will include: orient to surroundings, keep bed in low position, maintain call bell within reach at all times, provide assistance with transfer out of bed and ambulation.  

## 2021-03-19 NOTE — Patient Instructions (Signed)
____________________________________________________________________________________________  Medication Rules  Purpose: To inform patients, and their family members, of our rules and regulations.  Applies to: All patients receiving prescriptions (written or electronic).  Pharmacy of record: Pharmacy where electronic prescriptions will be sent. If written prescriptions are taken to a different pharmacy, please inform the nursing staff. The pharmacy listed in the electronic medical record should be the one where you would like electronic prescriptions to be sent.  Electronic prescriptions: In compliance with the [place] Strengthen Opioid Misuse Prevention (STOP) Act of 2017 (Session Law 2017-74/H243), effective December 22, 2018, all controlled substances must be electronically prescribed. Calling prescriptions to the pharmacy will cease to exist.  Prescription refills: Only during scheduled appointments. Applies to all prescriptions.  NOTE: The following applies primarily to controlled substances (Opioid* Pain Medications).   Type of encounter (visit): For patients receiving controlled substances, face-to-face visits are required. (Not an option or up to the patient.)  Patient's responsibilities: 1. Pain Pills: Bring all pain pills to every appointment (except for procedure appointments). 2. Pill Bottles: Bring pills in original pharmacy bottle. Always bring the newest bottle. Bring bottle, even if empty. 3. Medication refills: You are responsible for knowing and keeping track of what medications you take and those you need refilled. The day before your appointment: write a list of all prescriptions that need to be refilled. The day of the appointment: give the list to the admitting nurse. Prescriptions will be written only during appointments. No prescriptions will be written on procedure days. If you forget a medication: it will not be "Called in", "Faxed", or "electronically sent".  You will need to get another appointment to get these prescribed. No early refills. Do not call asking to have your prescription filled early. 4. Prescription Accuracy: You are responsible for carefully inspecting your prescriptions before leaving our office. Have the discharge nurse carefully go over each prescription with you, before taking them home. Make sure that your name is accurately spelled, that your address is correct. Check the name and dose of your medication to make sure it is accurate. Check the number of pills, and the written instructions to make sure they are clear and accurate. Make sure that you are given enough medication to last until your next medication refill appointment. 5. Taking Medication: Take medication as prescribed. When it comes to controlled substances, taking less pills or less frequently than prescribed is permitted and encouraged. Never take more pills than instructed. Never take medication more frequently than prescribed.  6. Inform other Doctors: Always inform, all of your healthcare providers, of all the medications you take. 7. Pain Medication from other Providers: You are not allowed to accept any additional pain medication from any other Doctor or Healthcare provider. There are two exceptions to this rule. (see below) In the event that you require additional pain medication, you are responsible for notifying us, as stated below. 8. Cough Medicine: Often these contain an opioid, such as codeine or hydrocodone. Never accept or take cough medicine containing these opioids if you are already taking an opioid* medication. The combination may cause respiratory failure and death. 9. Medication Agreement: You are responsible for carefully reading and following our Medication Agreement. This must be signed before receiving any prescriptions from our practice. Safely store a copy of your signed Agreement. Violations to the Agreement will result in no further prescriptions.  (Additional copies of our Medication Agreement are available upon request.) 10. Laws, Rules, & Regulations: All patients are expected to follow all   Federal and State Laws, Statutes, Rules, & Regulations. Ignorance of the Laws does not constitute a valid excuse.  11. Illegal drugs and Controlled Substances: The use of illegal substances (including, but not limited to marijuana and its derivatives) and/or the illegal use of any controlled substances is strictly prohibited. Violation of this rule may result in the immediate and permanent discontinuation of any and all prescriptions being written by our practice. The use of any illegal substances is prohibited. 12. Adopted CDC guidelines & recommendations: Target dosing levels will be at or below 60 MME/day. Use of benzodiazepines** is not recommended.  Exceptions: There are only two exceptions to the rule of not receiving pain medications from other Healthcare Providers. 1. Exception #1 (Emergencies): In the event of an emergency (i.e.: accident requiring emergency care), you are allowed to receive additional pain medication. However, you are responsible for: As soon as you are able, call our office [phone], at any time of the day or night, and leave a message stating your name, the date and nature of the emergency, and the name and dose of the medication prescribed. In the event that your call is answered by a member of our staff, make sure to document and save the date, time, and the name of the person that took your information.  2. Exception #2 (Planned Surgery): In the event that you are scheduled by another doctor or dentist to have any type of surgery or procedure, you are allowed (for a period no longer than 30 days), to receive additional pain medication, for the acute post-op pain. However, in this case, you are responsible for picking up a copy of our "Post-op Pain Management for Surgeons" handout, and giving it to your surgeon or dentist. This  document is available at our office, and does not require an appointment to obtain it. Simply go to our office during business hours (Monday-Thursday from 8:00 AM to 4:00 PM) (Friday 8:00 AM to 12:00 Noon) or if you have a scheduled appointment with us, prior to your surgery, and ask for it by name. In addition, you are responsible for: calling our office [phone], at any time of the day or night, and leaving a message stating your name, name of your surgeon, type of surgery, and date of procedure or surgery. Failure to comply with your responsibilities may result in termination of therapy involving the controlled substances.  *Opioid medications include: morphine, codeine, oxycodone, oxymorphone, hydrocodone, hydromorphone, meperidine, tramadol, tapentadol, buprenorphine, fentanyl, methadone. **Benzodiazepine medications include: diazepam (Valium), alprazolam (Xanax), clonazepam (Klonopine), lorazepam (Ativan), clorazepate (Tranxene), chlordiazepoxide (Librium), estazolam (Prosom), oxazepam (Serax), temazepam (Restoril), triazolam (Halcion) (Last updated: 11/19/2020) ____________________________________________________________________________________________   ____________________________________________________________________________________________  Medication Recommendations and Reminders  Applies to: All patients receiving prescriptions (written and/or electronic).  Medication Rules & Regulations: These rules and regulations exist for your safety and that of others. They are not flexible and neither are we. Dismissing or ignoring them will be considered "non-compliance" with medication therapy, resulting in complete and irreversible termination of such therapy. (See document titled "Medication Rules" for more details.) In all conscience, because of safety reasons, we cannot continue providing a therapy where the patient does not follow instructions.  Pharmacy of record:   Definition:  This is the pharmacy where your electronic prescriptions will be sent.   We do not endorse any particular pharmacy, however, we have experienced problems with [facility] not securing enough medication supply for the community.  We do not restrict you in your choice of pharmacy. However,   once we write for your prescriptions, we will NOT be re-sending more prescriptions to fix restricted supply problems created by your pharmacy, or your insurance.   The pharmacy listed in the electronic medical record should be the one where you want electronic prescriptions to be sent.  If you choose to change pharmacy, simply notify our nursing staff.  Recommendations:  Keep all of your pain medications in a safe place, under lock and key, even if you live alone. We will NOT replace lost, stolen, or damaged medication.  After you fill your prescription, take 1 week's worth of pills and put them away in a safe place. You should keep a separate, properly labeled bottle for this purpose. The remainder should be kept in the original bottle. Use this as your primary supply, until it runs out. Once it's gone, then you know that you have 1 week's worth of medicine, and it is time to come in for a prescription refill. If you do this correctly, it is unlikely that you will ever run out of medicine.  To make sure that the above recommendation works, it is very important that you make sure your medication refill appointments are scheduled at least 1 week before you run out of medicine. To do this in an effective manner, make sure that you do not leave the office without scheduling your next medication management appointment. Always ask the nursing staff to show you in your prescription , when your medication will be running out. Then arrange for the receptionist to get you a return appointment, at least 7 days before you run out of medicine. Do not wait until you have 1 or 2 pills left, to come in. This is very poor planning and  does not take into consideration that we may need to cancel appointments due to bad weather, sickness, or emergencies affecting our staff.  DO NOT ACCEPT A "Partial Fill": If for any reason your pharmacy does not have enough pills/tablets to completely fill or refill your prescription, do not allow for a "partial fill". The law allows the pharmacy to complete that prescription within 72 hours, without requiring a new prescription. If they do not fill the rest of your prescription within those 72 hours, you will need a separate prescription to fill the remaining amount, which we will NOT provide. If the reason for the partial fill is your insurance, you will need to talk to the pharmacist about payment alternatives for the remaining tablets, but again, DO NOT ACCEPT A PARTIAL FILL, unless you can trust your pharmacist to obtain the remainder of the pills within 72 hours.  Prescription refills and/or changes in medication(s):   Prescription refills, and/or changes in dose or medication, will be conducted only during scheduled medication management appointments. (Applies to both, written and electronic prescriptions.)  No refills on procedure days. No medication will be changed or started on procedure days. No changes, adjustments, and/or refills will be conducted on a procedure day. Doing so will interfere with the diagnostic portion of the procedure.  No phone refills. No medications will be "called into the pharmacy".  No Fax refills.  No weekend refills.  No Holliday refills.  No after hours refills.  Remember:  Business hours are:  Monday to Thursday 8:00 AM to 4:00 PM Provider's Schedule: Jaion Lagrange, MD - Appointments are:  Medication management: Monday and Wednesday 8:00 AM to 4:00 PM Procedure day: Tuesday and Thursday 7:30 AM to 4:00 PM Bilal Lateef, MD - Appointments are:    Medication management: Tuesday and Thursday 8:00 AM to 4:00 PM Procedure day: Monday and Wednesday  7:30 AM to 4:00 PM (Last update: 07/11/2020) ____________________________________________________________________________________________   ____________________________________________________________________________________________  CBD (cannabidiol) WARNING  Applicable to: All individuals currently taking or considering taking CBD (cannabidiol) and, more important, all patients taking opioid analgesic controlled substances (pain medication). (Example: oxycodone; oxymorphone; hydrocodone; hydromorphone; morphine; methadone; tramadol; tapentadol; fentanyl; buprenorphine; butorphanol; dextromethorphan; meperidine; codeine; etc.)  Legal status: CBD remains a Schedule I drug prohibited for any use. CBD is illegal with one exception. In the [place], CBD has a limited Food and Drug Administration (FDA) approval for the treatment of two specific types of epilepsy disorders. Only one CBD product has been approved by the FDA for this purpose: "Epidiolex". FDA is aware that some companies are marketing products containing cannabis and cannabis-derived compounds in ways that violate the Federal Food, Drug and Cosmetic Act (FD&C Act) and that may put the health and safety of consumers at risk. The FDA, a Federal agency, has not enforced the CBD status since 2018.   Legality: Some manufacturers ship CBD products nationally, which is illegal. Often such products are sold online and are therefore available throughout the country. CBD is openly sold in head shops and health food stores in some states where such sales have not been explicitly legalized. Selling unapproved products with unsubstantiated therapeutic claims is not only a violation of the law, but also can put patients at risk, as these products have not been proven to be safe or effective. Federal illegality makes it difficult to conduct research on CBD.  Reference: "FDA Regulation of Cannabis and Cannabis-Derived Products, Including Cannabidiol  (CBD)" - https://www.fda.gov/news-events/public-health-focus/fda-regulation-cannabis-and-cannabis-derived-products-including-cannabidiol-cbd  Warning: CBD is not FDA approved and has not undergo the same manufacturing controls as prescription drugs.  This means that the purity and safety of available CBD may be questionable. Most of the time, despite manufacturer's claims, it is contaminated with THC (delta-9-tetrahydrocannabinol - the chemical in marijuana responsible for the "HIGH").  When this is the case, the THC contaminant will trigger a positive urine drug screen (UDS) test for Marijuana (carboxy-THC). Because a positive UDS for any illicit substance is a violation of our medication agreement, your opioid analgesics (pain medicine) may be permanently discontinued.  MORE ABOUT CBD  General Information: CBD  is a derivative of the Marijuana (cannabis sativa) plant discovered in 1940. It is one of the 113 identified substances found in Marijuana. It accounts for up to 40% of the plant's extract. As of 2018, preliminary clinical studies on CBD included research for the treatment of anxiety, movement disorders, and pain. CBD is available and consumed in multiple forms, including inhalation of smoke or vapor, as an aerosol spray, and by mouth. It may be supplied as an oil containing CBD, capsules, dried cannabis, or as a liquid solution. CBD is thought not to be as psychoactive as THC (delta-9-tetrahydrocannabinol - the chemical in marijuana responsible for the "HIGH"). Studies suggest that CBD may interact with different biological target receptors in the body, including cannabinoid and other neurotransmitter receptors. As of 2018 the mechanism of action for its biological effects has not been determined.  Side-effects  Adverse reactions: Dry mouth, diarrhea, decreased appetite, fatigue, drowsiness, malaise, weakness, sleep disturbances, and others.  Drug interactions: CBC may interact with other  medications such as blood-thinners. (Last update: 07/28/2020) ____________________________________________________________________________________________   ____________________________________________________________________________________________  Drug Holidays (Slow)  What is a "Drug Holiday"? Drug Holiday: is the name given to the period of time during   which a patient stops taking a medication(s) for the purpose of eliminating tolerance to the drug.  Benefits . Improved effectiveness of opioids. . Decreased opioid dose needed to achieve benefits. . Improved pain with lesser dose.  What is tolerance? Tolerance: is the progressive decreased in effectiveness of a drug due to its repetitive use. With repetitive use, the body gets use to the medication and as a consequence, it loses its effectiveness. This is a common problem seen with opioid pain medications. As a result, a larger dose of the drug is needed to achieve the same effect that used to be obtained with a smaller dose.  How long should a "Drug Holiday" last? You should stay off of the pain medicine for at least 14 consecutive days. (2 weeks)  Should I stop the medicine "cold turkey"? No. You should always coordinate with your Pain Specialist so that he/she can provide you with the correct medication dose to make the transition as smoothly as possible.  How do I stop the medicine? Slowly. You will be instructed to decrease the daily amount of pills that you take by one (1) pill every seven (7) days. This is called a "slow downward taper" of your dose. For example: if you normally take four (4) pills per day, you will be asked to drop this dose to three (3) pills per day for seven (7) days, then to two (2) pills per day for seven (7) days, then to one (1) per day for seven (7) days, and at the end of those last seven (7) days, this is when the "Drug Holiday" would start.   Will I have withdrawals? By doing a "slow downward  taper" like this one, it is unlikely that you will experience any significant withdrawal symptoms. Typically, what triggers withdrawals is the sudden stop of a high dose opioid therapy. Withdrawals can usually be avoided by slowly decreasing the dose over a prolonged period of time. If you do not follow these instructions and decide to stop your medication abruptly, withdrawals may be possible.  What are withdrawals? Withdrawals: refers to the wide range of symptoms that occur after stopping or dramatically reducing opiate drugs after heavy and prolonged use. Withdrawal symptoms do not occur to patients that use low dose opioids, or those who take the medication sporadically. Contrary to benzodiazepine (example: Valium, Xanax, etc.) or alcohol withdrawals ("Delirium Tremens"), opioid withdrawals are not lethal. Withdrawals are the physical manifestation of the body getting rid of the excess receptors.  Expected Symptoms Early symptoms of withdrawal may include: . Agitation . Anxiety . Muscle aches . Increased tearing . Insomnia . Runny nose . Sweating . Yawning  Late symptoms of withdrawal may include: . Abdominal cramping . Diarrhea . Dilated pupils . Goose bumps . Nausea . Vomiting  Will I experience withdrawals? Due to the slow nature of the taper, it is very unlikely that you will experience any.  What is a slow taper? Taper: refers to the gradual decrease in dose.  (Last update: 07/11/2020) ____________________________________________________________________________________________     

## 2021-03-19 NOTE — Progress Notes (Signed)
PROVIDER NOTE: Information contained herein reflects review and annotations entered in association with encounter. Interpretation of such information and data should be left to medically-trained personnel. Information provided to patient can be located elsewhere in the medical record under "Patient Instructions". Document created using STT-dictation technology, any transcriptional errors that may result from process are unintentional.    Patient: Grace Zimmerman  Service Category: E/M  Provider: Gaspar Cola, MD  DOB: December 08, 1946  DOS: 03/19/2021  Specialty: Interventional Pain Management  MRN: [id number]  Setting: Ambulatory outpatient  PCP: Kirk Ruths, MD  Type: Established Patient    Referring Provider: Kirk Ruths, MD  Location: Office  Delivery: Face-to-face     Primary Reason(s) for Visit: Encounter for evaluation before starting new chronic pain management plan of care (Level of risk: moderate) CC: Back Pain  HPI  Grace Zimmerman is a 75 y.o. year old, female patient, who comes today for a follow-up evaluation to review the test results and decide on a treatment plan. 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[place]); Chronic low back pain (1ry area of Pain) (Bilateral) (R>L) w/o sciatica; DJD (degenerative joint disease) of cervical spine; Lumbar spondylosis; Major depression in remission ([place]); BMI 39.0-39.9,adult; Obesity; Other chronic pain; 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; Osteoarthritis; Osteoarthritis of carpometacarpal joint of right thumb; Primary osteoarthritis of left knee;  Healthcare maintenance; Screening for colorectal cancer; Senile ectropion of left lower eyelid; Senile ectropion of right lower eyelid; Spinal stenosis of lumbosacral region; Chronic pain syndrome; Pharmacologic therapy; Disorder of skeletal system; Problems influencing health status; Abnormal MRI, lumbar spine (05/31/2020); Lumbar postlaminectomy syndrome; Lumbar Grade 1 Anterolisthesis of L3/L4 and L4/L5; Lumbosacral Grade 1 Anterolisthesis of L5/S1; Lumbar Grade 1 Retrolisthesis of L2/L3; Lumbar facet hypertrophy (Multilevel) (Bilateral); Lumbar central spinal stenosis, 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); Lumbar lateral recess stenosis (Bilateral: L2-3) (Right: L3-4); Lumbar foraminal stenosis (Right: L2-3 L4-5) (Left: L5-S1) (Bilateral: L3-4); Annular tear of lumbar disc (Left: L4-5); Lumbosacral facet syndrome; DDD (degenerative disc disease), lumbosacral; Abnormal x-ray of lumbar spine (05/31/2020); History of TKR (total knee replacement) (Left); Failed spinal cord stimulator; Chronic shoulder pain (2ry area of Pain) (Bilateral) (L>R); Chronic upper back pain (Bilateral); Failed back surgical syndrome; Chronic lower extremity pain (3ry area of Pain) (Bilateral) (R>L); Chronic hip pain (4th area of Pain) (Bilateral) (R>L); Chronic knee pain (5th area of Pain) (Left); Chronic hip pain (Bilateral) after THR (total hip replacement) of both sides; Chronic knee pain (Bilateral) after TKR (total knee replacement) of both sides; Neurogenic pain; Chronic neuropathic pain; and Vitamin D deficiency on their problem list. Her primarily concern today is the Back Pain  Pain Assessment: Location: Lower Back Radiating: Denies Onset: More than a month ago Duration: Chronic pain Quality: Pressure,Throbbing,Aching,Sharp Severity: 5 /10 (subjective, self-reported pain score)  Effect on ADL: limits my daily activities Timing: Constant Modifying factors: meds, BP: 140/86  HR: 70  Grace Zimmerman comes in today for a  follow-up visit after her initial evaluation on 03/13/2021. Today we went over the results of her tests. These were explained in "Layman's terms". During today's appointment we went over my diagnostic impression, as well as the proposed treatment plan.  Patient followed by [facility], but patient has moved  and her insurance requires switching to a new provider.   [facility] Nada Libman, PA-C and Shirlyn Goltz, MD) Procedures done by Shirlyn Goltz, MD: Right L3-5 medial branch RFA (12/06/2020)  Right L4, L5, S1 MBB #2 (11/12/2020) Right L3, L4, L5 MBB #1 (10/10/2020) Neurosurgeon: Deetta Perla, MD and Haskel Khan, MD  According to the patient her primary area of pain is that of the lower back (Bilateral) (R>L).  She has had this problem for many years and she used to be a patient of Dr. Nila Nephew, who not only did a great deal of interventional therapies this patient, but he also did on 02/01/2015 a right-sided hemilaminectomy at L4-5 and L5-S1 with microdiscectomy and posterior decompression with foraminotomy and facetectomy at both level.  The patient also indicates having had a spinal cord stimulator implant which did not work and was removed.  (Surprisingly, the patient did not remember that she had back surgery done.)  Procedures done by Haskel Khan, MD: Bilateral L4 TFESI (07/18/2013) Bilateral L5 TFESI (07/18/2013) Bilateral L4 TFESI (04/03/2014) Bilateral L5 TFESI (04/03/2014) Bilateral L4 TFESI (05/29/2014) Bilateral L5 TFESI (05/29/2014) Bilateral L4 TFESI (12/04/2014) Bilateral L5 TFESI (12/04/2014) Right L4-5 and L5-S1 posterior hemilaminectomy with facetectomy and foraminotomy (02/01/2015) Bilateral L3-4 facet joint injection (08/02/2018) Bilateral L3-4 facet joint injection (11/08/2018)  She also describes having had physical therapy and a recent lumbar MRI as well as flexion-extension views of the lumbar spine.  These  revealed:  (05/31/2020) LUMBAR MRI FINDINGS:  Grade 1 Anterolisthesis of L3 on L4, L4 on L5, and L5 on S1 and trace Retrolisthesis of L2 on L3. There are mild endplate reactive changes at L3-L4 and L5-S1. No abnormal enhancement.   LEVELS: L1-2: Mild-moderate disc desiccation with small circumferential disc osteophyte complex, unchanged. No canal or foraminal stenosis.  L2-3: Mild disc desiccation with small right eccentric circumferential disc-osteophyte complex, unchanged. Together with bilateral facet hypertrophy, this results in unchanged narrowing of the bilateral lateral recesses as well as increased mild-moderate right foraminal narrowing.  L3-4: Mild disc desiccation with increased small circumferential disc bulge/pseudodisc. Together with severe bilateral facet hypertrophy and posterior epidural fat, this results in slightly increased moderate central canal stenosis and right lateral recess stenosis as well as unchanged mild bilateral foraminal narrowing.  L4-5: Postsurgical changes status post right hemilaminotomy. Increased moderate circumferential disc bulge/pseudodisc with left foraminal annular tear. Together with moderate bilateral facet hypertrophy, this results in increased mild right foraminal narrowing. No canal stenosis.  L5-S1: Postsurgical changes status post right hemilaminotomy. Small right eccentric circumferential disc bulge abuts the descending right S1 nerve roots. The large synovial cysts arising from the right facet joint seen on the prior exam have been resected. A tiny synovial cyst remains (series 9, image 32). Slightly increased mild left foraminal narrowing. No central canal stenosis or right foraminal narrowing.  Sacroiliac joints: Bilateral degenerative disease.  Soft tissues: There is soft tissueedema adjacent to the left L4-L5 and L5-S1 facet joints. Soft tissue edema adjacent to the right facet joints is likely postsurgical. Colonic diverticulosis.    IMPRESSION:  1. Slightly increased moderate central canal and right lateral recess stenosis at L3-4.  2. Increased mild-moderate right foraminal narrowing at L2-L3 and L4-L5 and slightly increased left foraminal narrowing at L5-S1.  3. A small right eccentric circumferential disc bulge abuts the descending right S1 nerve roots at L5-S1. However, the degree of stenosis has significantly improved status post hemilaminotomy.  (05/31/2020) LUMBAR X-RAYS FINDINGS:  Degenerative facet  changes throughout the lumbar spine. There is grade 1 anterolisthesis of L4 on L5. This anterolisthesis is essentially stable between neutral and extension views but appears to slightly increase in degree on flexion views some of this appearance may be related to differences in angulation of the vertebral bodies due to positioning, however. Some degree of instability cannot be excluded. There is grade 1 anterolisthesis of L3 on L4 which is stable between flexion and extension. There is minimal grade 1 retrolisthesis of L2 on L3 which is also stable flexion and extension. There is disc space narrowing at all levels of the lumbar spine.There are bilateral hip arthroplasties noted. There are endplate osteophytes at all levels of the lumbar spine.   IMPRESSION:  1. Multilevel degenerative disc disease and facet arthropathy throughout the lumbar spine with worsening disc space narrowing as compared to the prior studies.  2. Grade 1 anterolisthesis of L4 on L5 which appears slightly increased on flexion views relative to extension and neutral lateral views, suggestive of possible instability.  3. Minimal grade 1 anterolisthesis of L3 on L4 and minimal grade 1 retrolisthesis of L2 on L3 which are both stable between flexion and extension.  The patient secondary area of pain is that of the shoulders (Bilateral) (L>R), as well as upper back between the shoulder blades.  The patient denies any surgeries, physical therapy, or nerve  blocks.  The patient's third area pain is that of the lower extremities (Bilateral) (R>L), with a hip being worse than the knee.  The fourth area of pain is that of the hips (Bilateral) (R>L).  She does have a history of a bilateral hip arthroplasty.  The patient's fifth area pain is that of the knee (Left).  He describes the pain in the medial aspect of the knee and she does have a history of bilateral total knee replacements.  The patient indicates having generalized weakness in all of her joints.  Today she comes in extremely anxious due to the fact that she has already ran out of her pain medication and is trying to have somebody write for it, but did not realize the extent of the process required to evaluate and justify the need for a controlled substance.  Today I had a very long conversation with the patient regarding the medication rules and regulations of the clinic.  The patient was reminded that she needs to bring her pills to be counted and her bottles to be inspected even when they are empty.  Today I have also provided her with written information with regards to those rules.  The patient had the opportunity to read and sign her medication agreement.  A copy of the signed agreement was given to the patient.  Today I also went over her prior Lyrica trial which she failed secondary to getting dizzy when taking the medication.  From what I can gather from the [facility] PMP, the patient was started on 75 mg p.o. twice daily and quickly increased to 150 mg p.o. twice daily, without really allowing for the patient's body to get used to the medication.  It is my experience that this is typically responsible for this type of side effects where the patient feels dizzy, or very somnolent.  To make sure that we take full advantage of this medication, I have informed her that I will go ahead and repeat trial but this time we will go ahead and start her at 25 mg p.o. at bedtime and increase it  very slowly,  no sooner than every 3 weeks, allowing for steady levels to be reached before making any further changes in the dosing or schedule format.  I will be starting her at 25 mg p.o. at bedtime and after 15 days we will then increase it to 50 mg at bedtime, follow 15 days later by 75 mg at bedtime.  I will be seeing her back in about 30 days to assess her titration and adjust as necessary.  Regarding her opioid analgesics, I went over the patient's PMP and had the patient explained what medications seem to work best for her.  She indicated that the oxycodone seem to work better than the tramadol or the hydrocodone.  When asked about her dosing she indicated that she could not remember the dose of the medication, but she was taking it about twice a day.  Using this information I have decided to send a prescription to her pharmacy for oxycodone IR 5 mg to be taken 1 tablet p.o. twice daily #60/month.  I have reminded the patient that this is to be taking PRN and not necessarily around the clock.  In considering the treatment plan options, Grace Zimmerman was reminded that I no longer take patients for medication management only. I asked her to let me know if she had no intention of taking advantage of the interventional therapies, so that we could make arrangements to provide this space to someone interested. I also made it clear that undergoing interventional therapies for the purpose of getting pain medications is very inappropriate on the part of a patient, and it will not be tolerated in this practice. This type of behavior would suggest true addiction and therefore it requires referral to an addiction specialist.   Further details on both, my assessment(s), as well as the proposed treatment plan, please see below.  Controlled Substance Pharmacotherapy Assessment REMS (Risk Evaluation and Mitigation Strategy)  Analgesic: Oxycodone IR 5 mg, 1 tab p.o. twice daily (10 mg/day of oxycodone) (15  MME/day) MME/day: 15 mg/day  Pill Count: None expected due to no prior prescriptions written by our practice. Chauncey Fischer, RN  03/19/2021 12:57 PM  Sign when Signing Visit Safety precautions to be maintained throughout the outpatient stay will include: orient to surroundings, keep bed in low position, maintain call bell within reach at all times, provide assistance with transfer out of bed and ambulation.    Pharmacokinetics: Liberation and absorption (onset of action): WNL Distribution (time to peak effect): WNL Metabolism and excretion (duration of action): WNL         Pharmacodynamics: Desired effects: Analgesia: Grace Zimmerman reports >50% benefit. Functional ability: Patient reports that medication allows her to accomplish basic ADLs Clinically meaningful improvement in function (CMIF): Sustained CMIF goals met Perceived effectiveness: Described as relatively effective, allowing for increase in activities of daily living (ADL) Undesirable effects: Side-effects or Adverse reactions: None reported Monitoring: [place] PMP: PDMP reviewed during this encounter. Online review of the past 26-monthperiod previously conducted. Not applicable at this point since we have not taken over the patient's medication management yet. List of other Serum/Urine Drug Screening Test(s):  No results found for: AMPHSCRSER, BARBSCRSER, BENZOSCRSER, COCAINSCRSER, COCAINSCRNUR, PCPSCRSER, THCSCRSER, THCU, CANNABQUANT, [place] EIndian ShoresList of all UDS test(s) done:  Lab Results  Component Value Date   SUMMARY Note 03/13/2021   Last UDS on record: Summary  Date Value Ref Range Status  03/13/2021 Note  Final    Comment:    ==================================================================== Compliance Drug Analysis,  Ur ==================================================================== Test                             Result       Flag       Units  Drug Present and Declared for  Prescription Verification   Citalopram                     PRESENT      EXPECTED   Desmethylcitalopram            PRESENT      EXPECTED    Desmethylcitalopram is an expected metabolite of citalopram or the    enantiomeric form, escitalopram.    Trazodone                      PRESENT      EXPECTED   1,3 chlorophenyl piperazine    PRESENT      EXPECTED    1,3-chlorophenyl piperazine is an expected metabolite of trazodone.  ==================================================================== Test                      Result    Flag   Units      Ref Range   Creatinine              139              mg/dL      >=20 ==================================================================== Declared Medications:  The flagging and interpretation on this report are based on the  following declared medications.  Unexpected results may arise from  inaccuracies in the declared medications.   **Note: The testing scope of this panel includes these medications:   Escitalopram (Lexapro)  Trazodone   **Note: The testing scope of this panel does not include the  following reported medications:   Amoxicillin  Buspirone (Buspar)  Naloxone (Narcan)  Olmesartan (Benicar)  Ondansetron  Pantoprazole (Protonix) ==================================================================== For clinical consultation, please call [phone]. ====================================================================    UDS interpretation: No unexpected findings.          Medication Assessment Form: Patient introduced to form today Treatment compliance: Treatment may start today if patient agrees with proposed plan. Evaluation of compliance is not applicable at this point Risk Assessment Profile: Aberrant behavior: See initial evaluations. None observed or detected today Comorbid factors increasing risk of overdose: See initial evaluation. No additional risks detected today Opioid risk tool (ORT):  Opioid Risk   03/19/2021  Alcohol 3  Illegal Drugs 0  Rx Drugs 0  Alcohol 0  Illegal Drugs 0  Rx Drugs 0  Age between 16-45 years  0  History of Preadolescent Sexual Abuse 0  Psychological Disease 0  ADD Negative  OCD Negative  Bipolar Negative  Depression 1  Opioid Risk Tool Scoring 4  Opioid Risk Interpretation Moderate Risk    ORT Scoring interpretation table:  Score <3 = Low Risk for SUD  Score between 4-7 = Moderate Risk for SUD  Score >8 = High Risk for Opioid Abuse   Risk of substance use disorder (SUD): Low  Risk Mitigation Strategies:  Patient opioid safety counseling: Completed today. Counseling provided to patient as per "Patient Counseling Document". Document signed by patient, attesting to counseling and understanding Patient-Prescriber Agreement (PPA): Obtained today.  Controlled substance notification to other providers: Written and sent today.  Pharmacologic Plan: Today we may be taking over the patient's pharmacological regimen. See below.  Laboratory Chemistry Profile   Renal Lab Results  Component Value Date   BUN 29 (H) 01/09/2021   CREATININE 1.60 (H) 01/09/2021   GFRNONAA 33 (L) 01/09/2021     Electrolytes Lab Results  Component Value Date   NA 136 01/09/2021   K 3.9 01/09/2021   CL 101 01/09/2021   CALCIUM 8.9 01/09/2021   MG 2.6 (H) 03/13/2021     Hepatic Lab Results  Component Value Date   AST 19 01/09/2021   ALT 14 01/09/2021   ALBUMIN 4.0 01/09/2021   ALKPHOS 84 01/09/2021     ID Lab Results  Component Value Date   SARSCOV2NAA POSITIVE (A) 01/09/2021     Bone Lab Results  Component Value Date   VD25OH 14.23 (L) 03/13/2021     Endocrine Lab Results  Component Value Date   GLUCOSE 118 (H) 01/09/2021     Neuropathy Lab Results  Component Value Date   VITAMINB12 228 03/13/2021     CNS No results found for: COLORCSF, APPEARCSF, RBCCOUNTCSF, WBCCSF, POLYSCSF, LYMPHSCSF, EOSCSF, PROTEINCSF, GLUCCSF, JCVIRUS, CSFOLI,  IGGCSF, LABACHR, ACETBL, LABACHR, ACETBL   Inflammation (CRP: Acute  ESR: Chronic) Lab Results  Component Value Date   CRP 0.5 03/13/2021   ESRSEDRATE 28 03/13/2021     Rheumatology No results found for: RF, ANA, LABURIC, URICUR, LYMEIGGIGMAB, LYMEABIGMQN, HLAB27   Coagulation Lab Results  Component Value Date   PLT 176 01/09/2021     Cardiovascular Lab Results  Component Value Date   HGB 13.3 01/09/2021   HCT 40.5 01/09/2021     Screening Lab Results  Component Value Date   SARSCOV2NAA POSITIVE (A) 01/09/2021     Cancer No results found for: CEA, CA125, LABCA2   Allergens No results found for: ALMOND, APPLE, ASPARAGUS, AVOCADO, BANANA, BARLEY, BASIL, BAYLEAF, GREENBEAN, LIMABEAN, WHITEBEAN, BEEFIGE, REDBEET, BLUEBERRY, BROCCOLI, CABBAGE, MELON, CARROT, CASEIN, CASHEWNUT, CAULIFLOWER, CELERY     Note: Lab results reviewed.  Recent Diagnostic Imaging Review  (05/31/2020) LUMBAR MRI FINDINGS:  Grade 1 Anterolisthesis of L3 on L4, L4 on L5, and L5 on S1 and trace Retrolisthesis of L2 on L3. There are mild endplate reactive changes at L3-L4 and L5-S1. No abnormal enhancement.   LEVELS: L1-2: Mild-moderate disc desiccation with small circumferential disc osteophyte complex, unchanged. No canal or foraminal stenosis.  L2-3: Mild disc desiccation with small right eccentric circumferential disc-osteophyte complex, unchanged. Together with bilateral facet hypertrophy, this results in unchanged narrowing of the bilateral lateral recesses as well as increased mild-moderate right foraminal narrowing.  L3-4: Mild disc desiccation with increased small circumferential disc bulge/pseudodisc. Together with severe bilateral facet hypertrophy and posterior epidural fat, this results in slightly increased moderate central canal stenosis and right lateral recess stenosis as well as unchanged mild bilateral foraminal narrowing.  L4-5: Postsurgical changes status post right hemilaminotomy.  Increased moderate circumferential disc bulge/pseudodisc with left foraminal annular tear. Together with moderate bilateral facet hypertrophy, this results in increased mild right foraminal narrowing. No canal stenosis.  L5-S1: Postsurgical changes status post right hemilaminotomy. Small right eccentric circumferential disc bulge abuts the descending right S1 nerve roots. The large synovial cysts arising from the right facet joint seen on the prior exam have been resected. A tiny synovial cyst remains (series 9, image 32). Slightly increased mild left foraminal narrowing. No central canal stenosis or right foraminal narrowing.  Sacroiliac joints: Bilateral degenerative disease.  Soft tissues: There is soft tissueedema adjacent to the left L4-L5 and L5-S1 facet joints. Soft tissue edema adjacent to  the right facet joints is likely postsurgical. Colonic diverticulosis.   IMPRESSION:  1. Slightly increased moderate central canal and right lateral recess stenosis at L3-4.  2. Increased mild-moderate right foraminal narrowing at L2-L3 and L4-L5 and slightly increased left foraminal narrowing at L5-S1.  3. A small right eccentric circumferential disc bulge abuts the descending right S1 nerve roots at L5-S1. However, the degree of stenosis has significantly improved status post hemilaminotomy.  (05/31/2020) LUMBAR X-RAYS FINDINGS:  Degenerative facet changes throughout the lumbar spine. There is grade 1 anterolisthesis of L4 on L5. This anterolisthesis is essentially stable between neutral and extension views but appears to slightly increase in degree on flexion views some of this appearance may be related to differences in angulation of the vertebral bodies due to positioning, however. Some degree of instability cannot be excluded. There is grade 1 anterolisthesis of L3 on L4 which is stable between flexion and extension. There is minimal grade 1 retrolisthesis of L2 on L3 which is also stable flexion and  extension. There is disc space narrowing at all levels of the lumbar spine.There are bilateral hip arthroplasties noted. There are endplate osteophytes at all levels of the lumbar spine.   IMPRESSION:  1. Multilevel degenerative disc disease and facet arthropathy throughout the lumbar spine with worsening disc space narrowing as compared to the prior studies.  2. Grade 1 anterolisthesis of L4 on L5 which appears slightly increased on flexion views relative to extension and neutral lateral views, suggestive of possible instability.  3. Minimal grade 1 anterolisthesis of L3 on L4 and minimal grade 1 retrolisthesis of L2 on L3 which are both stable between flexion and extension.  Complexity Note: No results found under the [facility] record.                        Meds   Current Outpatient Medications:  .  amoxicillin (AMOXIL) 500 MG capsule, Take 500 mg by mouth as needed. Prior to dental work, Disp: , Rfl:  .  busPIRone (BUSPAR) 10 MG tablet, Take 20 mg by mouth in the morning, at noon, and at bedtime., Disp: , Rfl:  .  [START ON 03/21/2021] ergocalciferol (VITAMIN D2) 1.25 MG (50000 UT) capsule, Take 1 capsule (50,000 Units total) by mouth 2 (two) times a week. X 6 weeks., Disp: 12 capsule, Rfl: 0 .  escitalopram (LEXAPRO) 20 MG tablet, Take 20 mg by mouth daily., Disp: , Rfl:  .  naloxone (NARCAN) nasal spray 4 mg/0.1 mL, Place 4 mg into the nose as needed., Disp: , Rfl:  .  olmesartan (BENICAR) 40 MG tablet, Take 40 mg by mouth daily., Disp: , Rfl:  .  oxyCODONE (OXY IR/ROXICODONE) 5 MG immediate release tablet, Take 1 tablet (5 mg total) by mouth 2 (two) times daily as needed for severe pain. Must last 30 days., Disp: 60 tablet, Rfl: 0 .  pantoprazole (PROTONIX) 40 MG tablet, Take 1 tablet by mouth daily., Disp: , Rfl:  .  pregabalin (LYRICA) 25 MG capsule, Take 1 capsule (25 mg total) by mouth at bedtime for 15 days, THEN 2 capsules (50 mg total) at bedtime for 15 days,  THEN 3 capsules (75 mg total) at bedtime for 15 days., Disp: 90 capsule, Rfl: 0 .  traZODone (DESYREL) 150 MG tablet, Take 300 mg by mouth at bedtime., Disp: , Rfl:   ROS  Constitutional: Denies any fever or chills Gastrointestinal: No reported hemesis, hematochezia, vomiting, or acute GI  distress Musculoskeletal: Denies any acute onset joint swelling, redness, loss of ROM, or weakness Neurological: No reported episodes of acute onset apraxia, aphasia, dysarthria, agnosia, amnesia, paralysis, loss of coordination, or loss of consciousness  Allergies  Grace Zimmerman is allergic to latex.  PFSH  Drug: Grace Zimmerman  reports no history of drug use. Alcohol:  reports previous alcohol use. Tobacco:  reports that she has never smoked. She has never used smokeless tobacco. Medical:  has a past medical history of Anxiety, Depression, and Hypertension. Surgical: Ms. Deadwyler  has a past surgical history that includes Joint replacement; Hand surgery (Bilateral); and Abdominal hysterectomy. Family: family history is not on file.  Constitutional Exam  General appearance: Well nourished, well developed, and well hydrated. In no apparent acute distress Vitals:   03/19/21 1254  BP: 140/86  Pulse: 70  Temp: (!) 97.2 F (36.2 C)  SpO2: 100%  Weight: 230 lb (104.3 kg)  Height: _0  (1.676 m)   BMI Assessment: Estimated body mass index is 37.12 kg/m as calculated from the following:   Height as of this encounter: _1  (1.676 m).   Weight as of this encounter: 230 lb (104.3 kg).  BMI interpretation table: BMI level Category Range association with higher incidence of chronic pain  <18 kg/m2 Underweight   18.5-24.9 kg/m2 Ideal body weight   25-29.9 kg/m2 Overweight Increased incidence by 20%  30-34.9 kg/m2 Obese (Class I) Increased incidence by 68%  35-39.9 kg/m2 Severe obesity (Class II) Increased incidence by 136%  >40 kg/m2 Extreme obesity (Class III) Increased incidence by 254%   Patient's  current BMI Ideal Body weight  Body mass index is 37.12 kg/m. Ideal body weight: 59.3 kg (130 lb 11.7 oz) Adjusted ideal body weight: 77.3 kg (170 lb 7 oz)   BMI Readings from Last 4 Encounters:  03/19/21 37.12 kg/m  03/13/21 37.11 kg/m  01/09/21 34.93 kg/m   Wt Readings from Last 4 Encounters:  03/19/21 230 lb (104.3 kg)  03/13/21 223 lb (101.2 kg)  01/09/21 223 lb (101.2 kg)    Psych/Mental status: Alert, oriented x 3 (person, place, & time)       Eyes: PERLA Respiratory: No evidence of acute respiratory distress  Assessment & Plan  Primary Diagnosis & Pertinent Problem List: The primary encounter diagnosis was Chronic pain syndrome. 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  Visit Diagnosis: 1. Chronic pain syndrome   2. Chronic low back pain (1ry area of Pain) (Bilateral) (R>L) w/o sciatica   3. Chronic shoulder pain (2ry area of Pain) (Bilateral) (L>R)   4. Chronic lower extremity pain (3ry area of Pain) (Bilateral) (R>L)   5. Chronic hip pain (4th area of Pain) (Bilateral) (R>L)   6. Chronic knee pain (5th area of Pain) (Left)   7. Failed back surgical  syndrome   8.  Lumbar postlaminectomy syndrome   9. Lumbar Grade 1 Retrolisthesis of L2/L3   10. Lumbar Grade 1 Anterolisthesis of L3/L4 and L4/L5   11. Lumbosacral Grade 1 Anterolisthesis of L5/S1   12. 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 Pharmacologic therapy   16. Neurogenic pain   17. Chronic neuropathic pain   18. Vitamin D deficiency    Problems updated and reviewed during this visit: Problem  Failed Back Surgical Syndrome   (02/01/2015) Right-sided hemilaminectomy at L4-5 and L5-S1 with microdiscectomy and posterior decompression with foraminotomy and facetectomy at both level.    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    Chronic shoulder pain (2ry area of Pain) (Bilateral) (L>R)  Chronic low back pain (1ry area of Pain) (Bilateral) (R>L) w/o sciatica  Vitamin D Deficiency    Plan of Care  Pharmacotherapy (Medications Ordered): Meds ordered this encounter  Medications  . pregabalin (LYRICA) 25 MG capsule    Sig: Take 1 capsule (25 mg total) by mouth at bedtime for 15 days, THEN 2 capsules (50 mg total) at bedtime for 15 days, THEN 3 capsules (75 mg total) at bedtime for 15 days.    Dispense:  90 capsule    Refill:  0    Fill one day early if pharmacy is closed on scheduled refill  date. May substitute for generic if available.  Marland Kitchen oxyCODONE (OXY IR/ROXICODONE) 5 MG immediate release tablet    Sig: Take 1 tablet (5 mg total) by mouth 2 (two) times daily as needed for severe pain. Must last 30 days.    Dispense:  60 tablet    Refill:  0    Chronic Pain: STOP Act (Not applicable) Fill 1 day early if closed on refill date. Avoid benzodiazepines within 8 hours of opioids  . ergocalciferol (VITAMIN D2) 1.25 MG (50000 UT) capsule    Sig: Take 1 capsule (50,000 Units total) by mouth 2 (two) times a week. X 6 weeks.    Dispense:  12 capsule    Refill:  0    Fill one day early if pharmacy is closed on scheduled refill date. May substitute for generic, or similar, if available.   Procedure Orders    No procedure(s) ordered today   Lab Orders  No laboratory test(s) ordered today   Imaging Orders  No imaging studies ordered today   Referral Orders  No referral(s) requested today    Pharmacological management options:  Opioid Analgesics: Patient started on oxycodone IR 5 mg, 1 tab p.o. twice daily. Membrane stabilizer: Pregabalin (Lyrica) trial started at 25 mg p.o. at bedtime. Muscle relaxant: None prescribed at this time NSAID: None prescribed at this time Other analgesic(s): None prescribed at this time     Interventional Therapies  Risk  Complexity Considerations:   Estimated body  mass index is 37.12 kg/m as calculated from the following:   Height as of this encounter: _0  (1.676 m).   Weight as of this encounter: 230 lb (104.3 kg). Anxiety and depression disorder  Allergy: Latex     Planned  Pending:   Pending further evaluation   Under consideration:   Diagnostic bilateral lumbar facet MBB  Diagnostic midline caudal ESI + epidurogram  Diagnostic bilateral IA shoulder joint injection  Diagnostic bilateral suprascapular nerve block  Diagnostic bilateral femoral and obturator nerve block  Diagnostic left genicular nerves block     Completed at  other practices:   Failed Epidural Neurostimulator Implant (SCS)  Procedures done by Haskel Khan, MD: Therapeutic bilateral L3-4 facet joint injection x2 (08/02/2018, 11/08/2018)  Therapeutic bilateral L4 TFESI x4 (07/18/2013, 04/03/2014, 05/29/2014, 12/04/2014)  Therapeutic bilateral L5 TFESI x4 (07/18/2013, 04/03/2014, 05/29/2014, 12/04/2014)  Therapeutic right L4-5 and L5-S1 posterior hemilaminectomy with facetectomy and foraminotomy (02/01/2015)  Procedures done by Shirlyn Goltz, MD: Therapeutic right L3-5 medial branch RFA x1 (12/06/2020)  Therapeutic right L4, L5, S1 MBB x1 (11/12/2020) Therapeutic right L3, L4, L5 MBB x1 (10/10/2020)   Completed:   None at this time   Therapeutic  Palliative (PRN) options:   None established    Provider-requested follow-up: Return in about 1 month (around 04/19/2021) for (F2F), (MM). Recent Visits Date Type Provider Dept  03/13/21 Office Visit Milinda Pointer, MD [facility]  Showing recent visits within past 90 days and meeting all other requirements Today's Visits Date Type Provider Dept  03/19/21 Office Visit Milinda Pointer, MD [facility]  Showing today's visits and meeting all other requirements Future Appointments Date Type Provider Dept  04/17/21 Appointment Milinda Pointer, MD [facility]  Showing future appointments within next 90 days and meeting all other requirements  Primary Care Physician: Kirk Ruths, MD Note by: Gaspar Cola, MD Date: 03/19/2021; Time: 2:21 PM
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
## 2021-03-27 ENCOUNTER — Ambulatory Visit: Payer: Medicare HMO | Admitting: Pain Medicine

## 2021-03-28 ENCOUNTER — Encounter: Payer: Self-pay | Admitting: Psychiatry

## 2021-03-28 ENCOUNTER — Other Ambulatory Visit: Payer: Self-pay

## 2021-03-28 ENCOUNTER — Telehealth (INDEPENDENT_AMBULATORY_CARE_PROVIDER_SITE_OTHER): Payer: Medicare HMO | Admitting: Psychiatry

## 2021-03-28 DIAGNOSIS — F331 Major depressive disorder, recurrent, moderate: Secondary | ICD-10-CM | POA: Diagnosis not present

## 2021-03-28 MED ORDER — DULOXETINE HCL 60 MG PO CPEP: ORAL_CAPSULE | ORAL | 0 refills | Status: DC

## 2021-03-28 MED ORDER — DULOXETINE HCL 30 MG PO CPEP: 30.0000 mg | ORAL_CAPSULE | Freq: Every day | ORAL | 0 refills | Status: DC

## 2021-03-28 NOTE — Patient Instructions (Signed)
1. Start duloxetine 30 mg daily for one week, then 60 mg  2. Decrease lexapro 10 mg daily for one week, then discontinue 3. Continue Buspar 10 mg three times a day  4. Next appointment: 5/4 at 11 AM

## 2021-04-16 NOTE — Progress Notes (Signed)
PROVIDER NOTE: Information contained herein reflects review and annotations entered in association with encounter. Interpretation of such information and data should be left to medically-trained personnel. Information provided to patient can be located elsewhere in the medical record under "Patient Instructions". Document created using STT-dictation technology, any transcriptional errors that may result from process are unintentional.    Patient: Grace Zimmerman  Service Category: E/M  Provider: Gaspar Cola, MD  DOB: 1946/06/18  DOS: 04/17/2021  Specialty: Interventional Pain Management  MRN: [id number]  Setting: Ambulatory outpatient  PCP: Kirk Ruths, MD  Type: Established Patient    Referring Provider: Kirk Ruths, MD  Location: Office  Delivery: Face-to-face     HPI  Grace Zimmerman, a 75 y.o. year old female, is here today because of her Chronic pain syndrome [G89.4]. Grace Zimmerman's primary complain today is Back Pain (Lumbar bilateral ) Last encounter: My last encounter with her was on 03/19/2021. Pertinent problems: Grace Zimmerman has History of THR (total hip replacement) (Left); History of THR (total hip replacement) (Right); History of TKR (total knee replacement) (Right); Chronic low back pain (1ry area of Pain) (Bilateral) (R>L) w/o sciatica; DJD (degenerative joint disease) of cervical spine; Lumbar spondylosis; Other chronic pain; 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; Osteoarthritis; Osteoarthritis of carpometacarpal joint of right thumb; Primary osteoarthritis of left knee; Spinal stenosis of lumbosacral region; Chronic pain syndrome; Abnormal MRI, lumbar spine (05/31/2020); Lumbar postlaminectomy syndrome; Lumbar Grade 1 Anterolisthesis of L3/L4 and L4/L5; Lumbosacral Grade 1 Anterolisthesis of L5/S1; Lumbar Grade 1 Retrolisthesis of L2/L3; Lumbar facet hypertrophy (Multilevel) (Bilateral); Lumbar central spinal stenosis, w/o  neurogenic claudication (L3-4); Lumbar lateral recess stenosis (Bilateral: L2-3) (Right: L3-4); Lumbar foraminal stenosis (Right: L2-3 L4-5) (Left: L5-S1) (Bilateral: L3-4); Annular tear of lumbar disc (Left: L4-5); Lumbosacral facet syndrome; DDD (degenerative disc disease), lumbosacral; Abnormal x-ray of lumbar spine (05/31/2020); History of TKR (total knee replacement) (Left); Failed spinal cord stimulator; Chronic shoulder pain (2ry area of Pain) (Bilateral) (L>R); Chronic upper back pain (Bilateral); Failed back surgical syndrome; Chronic lower extremity pain (3ry area of Pain) (Bilateral) (R>L); Chronic hip pain (4th area of Pain) (Bilateral) (R>L); Chronic knee pain (5th area of Pain) (Left); Chronic hip pain (Bilateral) after THR (total hip replacement) of both sides; Chronic knee pain (Bilateral) 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 Pain Assessment: Severity of Chronic pain is reported as a 0-No pain/10. Location: Back Lower,Left,Right/into hips and legs and up the back as well. Onset: More than a month ago. Quality: Discomfort,Constant,Other (Comment) (whammo kind of pain when she is walking around). Timing: Constant. Modifying factor(s): medications. Vitals:  height is 5' 5"  (1.651 m) and weight is 230 lb (104.3 kg). Her temporal temperature is 97.3 F (36.3 C) (abnormal). Her blood pressure is 147/70 (abnormal) and her pulse is 65. Her respiration is 16 and oxygen saturation is 100%.   Reason for encounter: medication management.   The patient indicates doing well with the current medication regimen. No adverse reactions or side effects reported to the medications.   Here to evaluate her use of the oxycodone which we have provided to the patient for first time on 03/19/2021.  The patient indicates doing extremely well with her oxycodone having no side effects.  She is also surprised that she has been able to  tolerate the Lyrica since the prior trial she was unable to do that, mostly because they attempted to  start her on a higher dose.  Here we have given her time to get used to the medication and she has been tolerating the slow increases.  Tonight she will be going to 25 mg, 3 capsules (75 mg) p.o. at bedtime.  Since we do not know whether or not she will be able to tolerate that increase, today I will be refilling the Lyrica at 50 mg and I have instructed the patient to take one 50 mg tab, along with a 25 mg capsule for a total of 75 mg.  If she tolerates that, then we will continue to increase it further.  RTCB: 06/17/2021  Pharmacotherapy Assessment   Analgesic: Oxycodone IR 5 mg, 1 tab p.o. twice daily (10 mg/day of oxycodone) (15 MME/day) MME/day: 15 mg/day   Monitoring: [place] PMP: PDMP reviewed during this encounter.       Pharmacotherapy: No side-effects or adverse reactions reported. Compliance: No problems identified. Effectiveness: Clinically acceptable.  Janett Billow, RN  04/17/2021  2:40 PM  Sign when Signing Visit Nursing Pain Medication Assessment:  Safety precautions to be maintained throughout the outpatient stay will include: orient to surroundings, keep bed in low position, maintain call bell within reach at all times, provide assistance with transfer out of bed and ambulation.  Medication Inspection Compliance: Pill count conducted under aseptic conditions, in front of the patient. Neither the pills nor the bottle was removed from the patient's sight at any time. Once count was completed pills were immediately returned to the patient in their original bottle.  Medication: Oxycodone IR Pill/Patch Count: 17 of 60 pills remain Pill/Patch Appearance: Markings consistent with prescribed medication Bottle Appearance: Standard pharmacy container. Clearly labeled. Filled Date: 03 / 29 / 2022 Last Medication intake:  Today    UDS:  Summary  Date Value Ref Range Status   03/13/2021 Note  Final    Comment:    ==================================================================== Compliance Drug Analysis, Ur ==================================================================== Test                             Result       Flag       Units  Drug Present and Declared for Prescription Verification   Citalopram                     PRESENT      EXPECTED   Desmethylcitalopram            PRESENT      EXPECTED    Desmethylcitalopram is an expected metabolite of citalopram or the    enantiomeric form, escitalopram.    Trazodone                      PRESENT      EXPECTED   1,3 chlorophenyl piperazine    PRESENT      EXPECTED    1,3-chlorophenyl piperazine is an expected metabolite of trazodone.  ==================================================================== Test                      Result    Flag   Units      Ref Range   Creatinine              139              mg/dL      >=20 ==================================================================== Declared Medications:  The flagging and interpretation on this report are  based on the  following declared medications.  Unexpected results may arise from  inaccuracies in the declared medications.   **Note: The testing scope of this panel includes these medications:   Escitalopram (Lexapro)  Trazodone   **Note: The testing scope of this panel does not include the  following reported medications:   Amoxicillin  Buspirone (Buspar)  Naloxone (Narcan)  Olmesartan (Benicar)  Ondansetron  Pantoprazole (Protonix) ==================================================================== For clinical consultation, please call [phone]. ====================================================================      ROS  Constitutional: Denies any fever or chills Gastrointestinal: No reported hemesis, hematochezia, vomiting, or acute GI distress Musculoskeletal: Denies any acute onset joint swelling, redness, loss  of ROM, or weakness Neurological: No reported episodes of acute onset apraxia, aphasia, dysarthria, agnosia, amnesia, paralysis, loss of coordination, or loss of consciousness  Medication Review  DULoxetine, amoxicillin, busPIRone, ergocalciferol, naloxone, olmesartan, oxyCODONE, pantoprazole, pregabalin, torsemide, and traZODone  History Review  Allergy: Grace Zimmerman is allergic to latex. Drug: Grace Zimmerman  reports no history of drug use. Alcohol:  reports previous alcohol use. Tobacco:  reports that she has never smoked. She has never used smokeless tobacco. Social: Ms. Westra  reports that she has never smoked. She has never used smokeless tobacco. She reports previous alcohol use. She reports that she does not use drugs. Medical:  has a past medical history of Anxiety, Depression, and Hypertension. Surgical: Ms. Cambre  has a past surgical history that includes Joint replacement; Hand surgery (Bilateral); and Abdominal hysterectomy. Family: family history is not on file.  Laboratory Chemistry Profile   Renal Lab Results  Component Value Date   BUN 29 (H) 01/09/2021   CREATININE 1.60 (H) 01/09/2021   GFRNONAA 33 (L) 01/09/2021     Hepatic Lab Results  Component Value Date   AST 19 01/09/2021   ALT 14 01/09/2021   ALBUMIN 4.0 01/09/2021   ALKPHOS 84 01/09/2021     Electrolytes Lab Results  Component Value Date   NA 136 01/09/2021   K 3.9 01/09/2021   CL 101 01/09/2021   CALCIUM 8.9 01/09/2021   MG 2.6 (H) 03/13/2021     Bone Lab Results  Component Value Date   VD25OH 14.23 (L) 03/13/2021     Inflammation (CRP: Acute Phase) (ESR: Chronic Phase) Lab Results  Component Value Date   CRP 0.5 03/13/2021   ESRSEDRATE 28 03/13/2021       Note: Above Lab results reviewed.  Recent Imaging Review  DG Chest 1 View CLINICAL DATA:  Syncope  EXAM: CHEST  1 VIEW  COMPARISON:  None.  FINDINGS: The heart size and mediastinal contours are within normal  limits. Both lungs are clear. The visualized skeletal structures are unremarkable.  IMPRESSION: No active disease.  Electronically Signed   By: Constance Holster M.D.   On: 01/09/2021 00:45 Note: Reviewed        Physical Exam  General appearance: Well nourished, well developed, and well hydrated. In no apparent acute distress Mental status: Alert, oriented x 3 (person, place, & time)       Respiratory: No evidence of acute respiratory distress Eyes: PERLA Vitals: BP (!) 147/70 (BP Location: Left Arm, Patient Position: Sitting, Cuff Size: Normal)   Pulse 65   Temp (!) 97.3 F (36.3 C) (Temporal)   Resp 16   Ht 5' 5"  (1.651 m)   Wt 230 lb (104.3 kg)   SpO2 100%   BMI 38.27 kg/m  BMI: Estimated body mass index is 38.27 kg/m as calculated from the following:  Height as of this encounter: 5' 5"  (1.651 m).   Weight as of this encounter: 230 lb (104.3 kg). Ideal: Ideal body weight: 57 kg (125 lb 10.6 oz) Adjusted ideal body weight: 75.9 kg (167 lb 6.4 oz)  Assessment   Status Diagnosis  Controlled Controlled Controlled 1. Chronic pain syndrome   2. Chronic low back pain (1ry area of Pain) (Bilateral) (R>L) w/o sciatica   3. Chronic shoulder pain (2ry area of Pain) (Bilateral) (L>R)   4. Chronic lower extremity pain (3ry area of Pain) (Bilateral) (R>L)   5. Chronic hip pain (4th area of Pain) (Bilateral) (R>L)   6. Chronic knee pain (5th area of Pain) (Left)   7. Failed back surgical syndrome   8. Pharmacologic therapy   9. Chronic use of opiate for therapeutic purpose   10. Neurogenic pain   11. Chronic neuropathic pain      Updated Problems: Problem  Chronic Use of Opiate for Therapeutic Purpose    Plan of Care  Problem-specific:  No problem-specific Assessment & Plan notes found for this encounter.  Grace Zimmerman has a current medication list which includes the following long-term medication(s): duloxetine, ergocalciferol, olmesartan, [START ON  04/18/2021] oxycodone, [START ON 05/18/2021] oxycodone, pantoprazole, pregabalin, pregabalin, torsemide, and trazodone.  Pharmacotherapy (Medications Ordered): Meds ordered this encounter  Medications  . oxyCODONE (OXY IR/ROXICODONE) 5 MG immediate release tablet    Sig: Take 1 tablet (5 mg total) by mouth 2 (two) times daily as needed for severe pain. Must last 30 days.    Dispense:  60 tablet    Refill:  0    Not a duplicate. Do NOT delete! Dispense 1 day early if closed on refill date. Avoid benzodiazepines within 8 hours of opioids. Do not send refill requests.  Marland Kitchen oxyCODONE (OXY IR/ROXICODONE) 5 MG immediate release tablet    Sig: Take 1 tablet (5 mg total) by mouth 2 (two) times daily as needed for severe pain. Must last 30 days.    Dispense:  60 tablet    Refill:  0    Not a duplicate. Do NOT delete! Dispense 1 day early if closed on refill date. Avoid benzodiazepines within 8 hours of opioids. Do not send refill requests.  . pregabalin (LYRICA) 25 MG capsule    Sig: Take 1 capsule (25 mg total) by mouth at bedtime for 15 days, THEN 2 capsules (50 mg total) at bedtime for 15 days, THEN 3 capsules (75 mg total) at bedtime for 15 days.    Dispense:  90 capsule    Refill:  0    Instruct the patient to continue titration using the 50 mg tablet (1 tab p.o. at bedtime) and continue to build up using the 25 mg capsules.  . pregabalin (LYRICA) 50 MG capsule    Sig: Take 1 capsule (50 mg total) by mouth at bedtime.    Dispense:  30 capsule    Refill:  1    Fill one day early if pharmacy is closed on scheduled refill date. May substitute for generic if available.   Orders:  No orders of the defined types were placed in this encounter.  Follow-up plan:   Return in about 2 months (around 06/17/2021) for (F2F), (MM) to evaluate Lyrica titration.      Interventional Therapies  Risk  Complexity Considerations:   Estimated body mass index is 37.12 kg/m as calculated from the following:    Height as of this encounter: 5' 6"  (1.676 m).  Weight as of this encounter: 230 lb (104.3 kg). Anxiety and depression disorder  Allergy: Latex     Planned  Pending:   Pending further evaluation   Under consideration:   Diagnostic bilateral lumbar facet MBB  Diagnostic midline caudal ESI + epidurogram  Diagnostic bilateral IA shoulder joint injection  Diagnostic bilateral suprascapular nerve block  Diagnostic bilateral femoral and obturator nerve block  Diagnostic left genicular nerves block     Completed at other practices:   Failed Epidural Neurostimulator Implant (SCS)  Procedures done by Haskel Khan, MD: Therapeutic bilateral L3-4 facet joint injection x2 (08/02/2018, 11/08/2018)  Therapeutic bilateral L4 TFESI x4 (07/18/2013, 04/03/2014, 05/29/2014, 12/04/2014)  Therapeutic bilateral L5 TFESI x4 (07/18/2013, 04/03/2014, 05/29/2014, 12/04/2014)  Therapeutic right L4-5 and L5-S1 posterior hemilaminectomy with facetectomy and foraminotomy (02/01/2015)  Procedures done by Shirlyn Goltz, MD: Therapeutic right L3-5 medial branch RFA x1 (12/06/2020)  Therapeutic right L4, L5, S1 MBB x1 (11/12/2020) Therapeutic right L3, L4, L5 MBB x1 (10/10/2020)   Completed:   None at this time   Therapeutic  Palliative (PRN) options:   None established     Recent Visits Date Type Provider Dept  03/19/21 Office Visit Milinda Pointer, MD [facility]  03/13/21 Office Visit Milinda Pointer, MD [facility]  Showing recent visits within past 90 days and meeting all other requirements Today's Visits Date Type Provider Dept  04/17/21 Office Visit Milinda Pointer, MD [facility]  Showing today's visits and meeting all other requirements Future Appointments Date Type Provider Dept  06/12/21 Appointment Milinda Pointer, MD [facility]  Showing future appointments within next 90 days and meeting all other requirements  I discussed the  assessment and treatment plan with the patient. The patient was provided an opportunity to ask questions and all were answered. The patient agreed with the plan and demonstrated an understanding of the instructions.  Patient advised to call back or seek an in-person evaluation if the symptoms or condition worsens.  Duration of encounter: 30 minutes.  Note by: Gaspar Cola, MD Date: 04/17/2021; Time: 3:48 PM
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
## 2021-04-17 ENCOUNTER — Ambulatory Visit: Payer: Medicare HMO | Attending: Pain Medicine | Admitting: Pain Medicine

## 2021-04-17 ENCOUNTER — Other Ambulatory Visit: Payer: Self-pay

## 2021-04-17 ENCOUNTER — Encounter: Payer: Self-pay | Admitting: Pain Medicine

## 2021-04-17 VITALS — BP 147/70 | HR 65 | Temp 97.3°F | Resp 16 | Ht 65.0 in | Wt 230.0 lb

## 2021-04-17 DIAGNOSIS — M25551 Pain in right hip: Secondary | ICD-10-CM | POA: Diagnosis present

## 2021-04-17 DIAGNOSIS — Z79891 Long term (current) use of opiate analgesic: Secondary | ICD-10-CM | POA: Insufficient documentation

## 2021-04-17 DIAGNOSIS — M79604 Pain in right leg: Secondary | ICD-10-CM | POA: Diagnosis present

## 2021-04-17 DIAGNOSIS — Z79899 Other long term (current) drug therapy: Secondary | ICD-10-CM | POA: Insufficient documentation

## 2021-04-17 DIAGNOSIS — M25562 Pain in left knee: Secondary | ICD-10-CM | POA: Diagnosis present

## 2021-04-17 DIAGNOSIS — M545 Low back pain, unspecified: Secondary | ICD-10-CM | POA: Diagnosis not present

## 2021-04-17 DIAGNOSIS — M792 Neuralgia and neuritis, unspecified: Secondary | ICD-10-CM | POA: Insufficient documentation

## 2021-04-17 DIAGNOSIS — M25512 Pain in left shoulder: Secondary | ICD-10-CM | POA: Insufficient documentation

## 2021-04-17 DIAGNOSIS — G894 Chronic pain syndrome: Secondary | ICD-10-CM | POA: Insufficient documentation

## 2021-04-17 DIAGNOSIS — M25552 Pain in left hip: Secondary | ICD-10-CM | POA: Diagnosis present

## 2021-04-17 DIAGNOSIS — M79605 Pain in left leg: Secondary | ICD-10-CM | POA: Insufficient documentation

## 2021-04-17 DIAGNOSIS — G8929 Other chronic pain: Secondary | ICD-10-CM | POA: Insufficient documentation

## 2021-04-17 DIAGNOSIS — M25511 Pain in right shoulder: Secondary | ICD-10-CM | POA: Diagnosis present

## 2021-04-17 DIAGNOSIS — M961 Postlaminectomy syndrome, not elsewhere classified: Secondary | ICD-10-CM | POA: Insufficient documentation

## 2021-04-17 MED ORDER — OXYCODONE HCL 5 MG PO TABS: 5.0000 mg | ORAL_TABLET | Freq: Two times a day (BID) | ORAL | 0 refills | Status: DC | PRN

## 2021-04-17 MED ORDER — PREGABALIN 50 MG PO CAPS: 50.0000 mg | ORAL_CAPSULE | Freq: Every day | ORAL | 1 refills | Status: DC

## 2021-04-17 MED ORDER — PREGABALIN 25 MG PO CAPS: ORAL_CAPSULE | ORAL | 0 refills | Status: DC

## 2021-04-17 NOTE — Progress Notes (Signed)
Nursing Pain Medication Assessment:  Safety precautions to be maintained throughout the outpatient stay will include: orient to surroundings, keep bed in low position, maintain call bell within reach at all times, provide assistance with transfer out of bed and ambulation.  Medication Inspection Compliance: Pill count conducted under aseptic conditions, in front of the patient. Neither the pills nor the bottle was removed from the patient's sight at any time. Once count was completed pills were immediately returned to the patient in their original bottle.  Medication: Oxycodone IR Pill/Patch Count: 17 of 60 pills remain Pill/Patch Appearance: Markings consistent with prescribed medication Bottle Appearance: Standard pharmacy container. Clearly labeled. Filled Date: 03 / 29 / 2022 Last Medication intake:  Today

## 2021-04-17 NOTE — Patient Instructions (Signed)
Pregabalin capsules What is this medicine? PREGABALIN (pre GAB a lin) is used to treat nerve pain from diabetes, shingles, spinal cord injury, and fibromyalgia. It is also used to control seizures in epilepsy. This medicine may be used for other purposes; ask your health care provider or pharmacist if you have questions. COMMON BRAND NAME(S): Lyrica What should I tell my health care provider before I take this medicine? They need to know if you have any of these conditions:  drug abuse or addiction  heart failure  kidney disease  lung disease  suicidal thoughts, plans or attempt  an unusual or allergic reaction to pregabalin, other medicines, foods, dyes, or preservatives  pregnant or trying to get pregnant  breast-feeding How should I use this medicine? Take this medicine by mouth with water. Take it as directed on the prescription label at the same time every day. You can take it with or without food. If it upsets your stomach, take it with food. Keep taking it unless your health care provider tells you to stop. A special MedGuide will be given to you by the pharmacist with each prescription and refill. Be sure to read this information carefully each time. Talk to your health care provider about the use of this medicine in children. While it may be prescribed for children as young as 1 month for selected conditions, precautions do apply. Overdosage: If you think you have taken too much of this medicine contact a poison control center or emergency room at once. NOTE: This medicine is only for you. Do not share this medicine with others. What if I miss a dose? If you miss a dose, take it as soon as you can. If it is almost time for your next dose, take only that dose. Do not take double or extra doses. What may interact with this medicine?  alcohol  antihistamines for allergy, cough, and cold  certain medicines for anxiety or sleep  certain medicines for blood pressure, heart  disease  certain medicines for depression like amitriptyline, fluoxetine, sertraline  certain medicines for diabetes, like pioglitazone, rosiglitazone  certain medicines for seizures like phenobarbital, primidone  general anesthetics like halothane, isoflurane, methoxyflurane, propofol  medicines that relax muscles for surgery  narcotic medicines for pain  phenothiazines like chlorpromazine, mesoridazine, prochlorperazine, thioridazine This list may not describe all possible interactions. Give your health care provider a list of all the medicines, herbs, non-prescription drugs, or dietary supplements you use. Also tell them if you smoke, drink alcohol, or use illegal drugs. Some items may interact with your medicine. What should I watch for while using this medicine? Visit your health care provider for regular checks on your progress. Tell your health care provider if your symptoms do not start to get better or if they get worse. Do not suddenly stop taking this medicine. You may develop a severe reaction. Your health care provider will tell you how much medicine to take. If your health care provider wants you to stop the medicine, the dose may be slowly lowered over time to avoid any side effects. You may get drowsy or dizzy. Do not drive, use machinery, or do anything that needs mental alertness until you know how this medicine affects you. Do not stand up or sit up quickly, especially if you are an older patient. This reduces the risk of dizzy or fainting spells. Alcohol may interfere with the effect of this medicine. Avoid alcoholic drinks. If you or your family notice any changes in your   behavior, such as new or worsening depression, thoughts of harming yourself, anxiety, other unusual or disturbing thoughts, or memory loss, call your health care provider right away. Wear a medical ID bracelet or chain if you are taking this medicine for seizures. Carry a card that describes your condition.  List the medicines and doses you take on the card. This medicine may make it more difficult to father a child. Talk to your health care provider if you are concerned about your fertility. What side effects may I notice from receiving this medicine? Side effects that you should report to your doctor or health care provider as soon as possible:  allergic reactions (skin rash, itching or hives; swelling of the face, lips, or tongue)  changes in vision  edema (sudden weight gain; swelling of the ankles, feet, hands or other unusual swelling; trouble breathing)  muscle injury (dark urine; trouble passing urine or change in the amount of urine; unusually weak or tired; muscle pain; back pain)  suicidal thoughts, mood changes  trouble breathing  unusual bruising or bleeding Side effects that usually do not require medical attention (report these to your doctor or health care provider if they continue or are bothersome):  dizziness  dry mouth  tiredness  weight gain This list may not describe all possible side effects. Call your doctor for medical advice about side effects. You may report side effects to FDA at 1-800-FDA-1088. Where should I keep my medicine? Keep out of the reach of children and pets. This medicine can be abused. Keep it in a safe place to protect it from theft. Do not share it with anyone. It is only for you. Selling or giving away this medicine is dangerous and against the law. Store at 25 degrees C (77 degrees F). Get rid of any unused medicine after the expiration date. This medicine may cause harm and death if it is taken by other adults, children, or pets. It is important to get rid of the medicine as soon as you no longer need it or it is expired. You can do this in two ways:  Take the medicine to a medicine take-back program. Check with your pharmacy or law enforcement to find a location.  If you cannot return the medicine, check the label or package insert to see  if the medicine should be thrown out in the garbage or flushed down the toilet. If you are not sure, ask your health care provider. If it is safe to put it in the trash, take the medicine out of the container. Mix the medicine with cat litter, dirt, coffee grounds, or other unwanted substance. Seal the mixture in a bag or container. Put it in the trash. NOTE: This sheet is a summary. It may not cover all possible information. If you have questions about this medicine, talk to your doctor, pharmacist, or health care provider.  2021 Elsevier/Gold Standard (2020-03-23 22:46:17)  

## 2021-04-23 DIAGNOSIS — T402X5A Adverse effect of other opioids, initial encounter: Secondary | ICD-10-CM | POA: Insufficient documentation

## 2021-04-23 DIAGNOSIS — Z862 Personal history of diseases of the blood and blood-forming organs and certain disorders involving the immune mechanism: Secondary | ICD-10-CM | POA: Insufficient documentation

## 2021-04-24 ENCOUNTER — Other Ambulatory Visit: Payer: Self-pay

## 2021-04-24 ENCOUNTER — Telehealth (INDEPENDENT_AMBULATORY_CARE_PROVIDER_SITE_OTHER): Payer: Medicare HMO | Admitting: Psychiatry

## 2021-04-24 ENCOUNTER — Encounter: Payer: Self-pay | Admitting: Psychiatry

## 2021-04-24 DIAGNOSIS — F419 Anxiety disorder, unspecified: Secondary | ICD-10-CM | POA: Diagnosis not present

## 2021-04-24 DIAGNOSIS — F3341 Major depressive disorder, recurrent, in partial remission: Secondary | ICD-10-CM

## 2021-04-24 MED ORDER — DULOXETINE HCL 60 MG PO CPEP: 60.0000 mg | ORAL_CAPSULE | Freq: Every day | ORAL | 0 refills | Status: DC

## 2021-04-24 NOTE — Patient Instructions (Signed)
1. Continue duloxetine 60 mg  2. Decrease Buspar 10 mg twice a day  3. Next appointment: 6/29 at 9:30

## 2021-06-11 NOTE — Progress Notes (Signed)
PROVIDER NOTE: Information contained herein reflects review and annotations entered in association with encounter. Interpretation of such information and data should be left to medically-trained personnel. Information provided to patient can be located elsewhere in the medical record under "Patient Instructions". Document created using STT-dictation technology, any transcriptional errors that may result from process are unintentional.    Patient: Grace Zimmerman  Service Category: E/M  Provider: Gaspar Cola, MD  DOB: 29-Jun-1946  DOS: 06/12/2021  Specialty: Interventional Pain Management  MRN: [id number]  Setting: Ambulatory outpatient  PCP: Kirk Ruths, MD  Type: Established Patient    Referring Provider: Kirk Ruths, MD  Location: Office  Delivery: Face-to-face     HPI  Grace Zimmerman, a 75 y.o. year old female, is here today because of her Chronic pain syndrome [G89.4]. Grace Zimmerman's primary complain today is Back Pain Last encounter: My last encounter with her was on 04/17/2021. 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; Osteoarthritis; Osteoarthritis of carpometacarpal joint of right thumb; Spinal stenosis of lumbosacral region; Chronic pain syndrome; Abnormal MRI, lumbar spine (05/31/2020); Lumbar postlaminectomy syndrome; Lumbar Grade 1 Anterolisthesis of L3/L4 and L4/L5; Lumbosacral Grade 1 Anterolisthesis of L5/S1; Lumbar Grade 1 Retrolisthesis of L2/L3; Lumbar facet hypertrophy (Multilevel) (Bilateral); Lumbar central spinal stenosis, 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); Lumbar lateral recess stenosis (Bilateral: L2-3) (Right: L3-4);  Lumbar foraminal stenosis (Right: L2-3 L4-5) (Left: L5-S1) (Bilateral: L3-4); Annular tear of lumbar disc (Left: L4-5); Lumbosacral facet syndrome; DDD (degenerative disc disease), lumbosacral; Abnormal x-ray of lumbar spine (05/31/2020); Failed spinal cord stimulator; Chronic shoulder pain (2ry area of Pain) (Bilateral) (L>R); Chronic upper back pain (Bilateral); Failed back surgical syndrome; Chronic lower extremity pain (3ry area of Pain) (Bilateral) (R>L); Chronic hip pain (Bilateral) (R>L); Chronic knee pain (5th area of Pain) (Left); Chronic hip pain after THR (total hip replacement) (4th area of Pain) (Bilateral); Chronic knee pain (Bilateral) 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 Pain Assessment: Severity of Chronic pain is reported as a 2 /10. Location: Back Lower/Denies. Onset: More than a month ago. Quality: Sharp. Timing: Constant. Modifying factor(s): sit down and meds. Vitals:  height is 5' 5"  (1.651 m) and weight is 230 lb (104.3 kg). Her temperature is 97.2 F (36.2 C) (abnormal). Her blood pressure is 150/72 (abnormal) and her pulse is 62. Her oxygen saturation is 100%.   Reason for encounter: medication management.   The patient indicates doing well with the current medication regimen. No adverse reactions or side effects reported to the medications.  The patient indicates currently taking the Lyrica 50 mg p.o. at bedtime with excellent benefits and no side effects.  Today the patient comes in to evaluate the Lyrica titration.  Today we will transfer the Lyrica to the patient's PCP and we will continue to manage her opioid analgesics.  RTCB: 09/15/2021 Nonopioids transferred 06/12/2021: Lyrica  Pharmacotherapy Assessment  Analgesic: Oxycodone IR 5 mg, 1 tab p.o. twice daily (10 mg/day of oxycodone) (15 MME/day) MME/day: 15 mg/day   Monitoring: [place] PMP: PDMP reviewed during this encounter.        Pharmacotherapy: No side-effects or adverse reactions reported. Compliance: No problems identified. Effectiveness: Clinically acceptable.  Grace Fischer, Grace Zimmerman  06/12/2021  1:36 PM  Sign when Signing Visit Safety precautions to be maintained throughout the outpatient stay will include: orient to surroundings, keep bed in low position, maintain call bell within reach at all times, provide assistance with transfer out of bed and ambulation.  Nursing Pain Medication Assessment:  Safety precautions to be maintained throughout the outpatient stay will include: orient to surroundings, keep bed in low position, maintain call bell within reach at all times, provide assistance with transfer out of bed and ambulation.  Medication Inspection Compliance: Pill count conducted under aseptic conditions, in front of the patient. Neither the pills nor the bottle was removed from the patient's sight at any time. Once count was completed pills were immediately returned to the patient in their original bottle.  Medication: Oxycodone IR Pill/Patch Count:  13 of 60 pills remain Pill/Patch Appearance: Markings consistent with prescribed medication Bottle Appearance: Standard pharmacy container. Clearly labeled. Filled Date: 5 / 28 / 2022 Last Medication intake:  Yesterday     UDS:  Summary  Date Value Ref Range Status  03/13/2021 Note  Final    Comment:    ==================================================================== Compliance Drug Analysis, Ur ==================================================================== Test                             Result       Flag       Units  Drug Present and Declared for Prescription Verification   Citalopram                     PRESENT      EXPECTED   Desmethylcitalopram            PRESENT      EXPECTED    Desmethylcitalopram is an expected metabolite of citalopram or the    enantiomeric form, escitalopram.    Trazodone                      PRESENT      EXPECTED    1,3 chlorophenyl piperazine    PRESENT      EXPECTED    1,3-chlorophenyl piperazine is an expected metabolite of trazodone.  ==================================================================== Test                      Result    Flag   Units      Ref Range   Creatinine              139              mg/dL      >=20 ==================================================================== Declared Medications:  The flagging and interpretation on this report are based on the  following declared medications.  Unexpected results may arise from  inaccuracies in the declared medications.   **Note: The testing scope of this panel includes these medications:   Escitalopram (Lexapro)  Trazodone   **Note: The testing scope of this panel does not include the  following reported medications:   Amoxicillin  Buspirone (Buspar)  Naloxone (Narcan)  Olmesartan (Benicar)  Ondansetron  Pantoprazole (Protonix) ==================================================================== For clinical consultation, please call [phone]. ====================================================================      ROS  Constitutional: Denies any fever or chills Gastrointestinal: No reported hemesis, hematochezia, vomiting, or acute GI distress Musculoskeletal: Denies any acute onset joint swelling, redness, loss of ROM, or weakness Neurological: No reported episodes of acute onset apraxia, aphasia, dysarthria,  agnosia, amnesia, paralysis, loss of coordination, or loss of consciousness  Medication Review  DULoxetine, busPIRone, ergocalciferol, naloxone, olmesartan, oxyCODONE, pantoprazole, pregabalin, torsemide, and traZODone  History Review  Allergy: Grace Zimmerman is allergic to latex. Drug: Grace Zimmerman  reports no history of drug use. Alcohol:  reports previous alcohol use. Tobacco:  reports that she has never smoked. She has never used smokeless tobacco. Social: Grace Zimmerman  reports that she has never  smoked. She has never used smokeless tobacco. She reports previous alcohol use. She reports that she does not use drugs. Medical:  has a past medical history of Anxiety, Depression, and Hypertension. Surgical: Grace Zimmerman  has a past surgical history that includes Joint replacement; Hand surgery (Bilateral); and Abdominal hysterectomy. Family: family history is not on file.  Laboratory Chemistry Profile   Renal Lab Results  Component Value Date   BUN 29 (H) 01/09/2021   CREATININE 1.60 (H) 01/09/2021   GFRNONAA 33 (L) 01/09/2021     Hepatic Lab Results  Component Value Date   AST 19 01/09/2021   ALT 14 01/09/2021   ALBUMIN 4.0 01/09/2021   ALKPHOS 84 01/09/2021     Electrolytes Lab Results  Component Value Date   NA 136 01/09/2021   K 3.9 01/09/2021   CL 101 01/09/2021   CALCIUM 8.9 01/09/2021   MG 2.6 (H) 03/13/2021     Bone Lab Results  Component Value Date   VD25OH 14.23 (L) 03/13/2021     Inflammation (CRP: Acute Phase) (ESR: Chronic Phase) Lab Results  Component Value Date   CRP 0.5 03/13/2021   ESRSEDRATE 28 03/13/2021       Note: Above Lab results reviewed.  Recent Imaging Review  DG Chest 1 View CLINICAL DATA:  Syncope  EXAM: CHEST  1 VIEW  COMPARISON:  None.  FINDINGS: The heart size and mediastinal contours are within normal limits. Both lungs are clear. The visualized skeletal structures are unremarkable.  IMPRESSION: No active disease.  Electronically Signed   By: Constance Holster M.D.   On: 01/09/2021 00:45 Note: Reviewed        Physical Exam  General appearance: Well nourished, well developed, and well hydrated. In no apparent acute distress Mental status: Alert, oriented x 3 (person, place, & time)       Respiratory: No evidence of acute respiratory distress Eyes: PERLA Vitals: BP (!) 150/72   Pulse 62   Temp (!) 97.2 F (36.2 C)   Ht 5' 5"  (1.651 m)   Wt 230 lb (104.3 kg)   SpO2 100%   BMI 38.27 kg/m  BMI: Estimated  body mass index is 38.27 kg/m as calculated from the following:   Height as of this encounter: 5' 5"  (1.651 m).   Weight as of this encounter: 230 lb (104.3 kg). Ideal: Ideal body weight: 57 kg (125 lb 10.6 oz) Adjusted ideal body weight: 75.9 kg (167 lb 6.4 oz)  Assessment   Status Diagnosis  Controlled Controlled Controlled 1. Chronic pain syndrome   2. Chronic low back pain (1ry area of Pain) (Bilateral) (R>L) w/o sciatica   3. Chronic shoulder pain (2ry area of Pain) (Bilateral) (L>R)   4. Chronic lower extremity pain (3ry area of Pain) (Bilateral) (R>L)   5. 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 Chronic knee pain (5th area of Pain) (Left)   7. Chronic neuropathic pain   8. Pharmacologic therapy   9. Chronic use of opiate for  therapeutic purpose   10. Encounter for chronic pain management      Updated Problems: Problem  Chronic hip pain (Bilateral) (R>L)  Chronic hip pain after THR (total hip replacement) (4th area of Pain) (Bilateral)  Lumbar Spondylosis  History of THR (total hip replacement) (Bilateral)  History of TKR (total knee replacement) (Bilateral)  Benign Hypertension With Ckd (Chronic Kidney Disease) Stage III (Hcc)   Formatting of this note might be different from the original. Torsemide replaced hct with edema  Last Assessment & Plan:  Formatting of this note might be different from the original. Started on torsemide with edema a month ago    Therapeutic Opioid-Induced Constipation (Oic)  History of Normocytic Normochromic Anemia  Primary Osteoarthritis of Left Knee (Resolved)    Plan of Care  Problem-specific:  No problem-specific Assessment & Plan notes found for this encounter.  Grace Zimmerman has a current medication list which includes the following long-term medication(s): duloxetine, [START ON 06/17/2021] oxycodone, [START ON 07/17/2021] oxycodone, [START ON 08/16/2021] oxycodone,  pantoprazole, torsemide, trazodone, ergocalciferol, olmesartan, pregabalin, and pregabalin.  Pharmacotherapy (Medications Ordered): Meds ordered this encounter  Medications   pregabalin (LYRICA) 50 MG capsule    Sig: Take 1 capsule (50 mg total) by mouth at bedtime.    Dispense:  30 capsule    Refill:  2    Fill one day early if pharmacy is closed on scheduled refill date. May substitute for generic if available.   oxyCODONE (OXY IR/ROXICODONE) 5 MG immediate release tablet    Sig: Take 1 tablet (5 mg total) by mouth 2 (two) times daily as needed for severe pain. Must last 30 days.    Dispense:  60 tablet    Refill:  0    Not a duplicate. Do NOT delete! Dispense 1 day early if closed on refill date. Avoid benzodiazepines within 8 hours of opioids. Do not send refill requests.   oxyCODONE (OXY IR/ROXICODONE) 5 MG immediate release tablet    Sig: Take 1 tablet (5 mg total) by mouth 2 (two) times daily as needed for severe pain. Must last 30 days.    Dispense:  60 tablet    Refill:  0    Not a duplicate. Do NOT delete! Dispense 1 day early if closed on refill date. Avoid benzodiazepines within 8 hours of opioids. Do not send refill requests.   oxyCODONE (OXY IR/ROXICODONE) 5 MG immediate release tablet    Sig: Take 1 tablet (5 mg total) by mouth 2 (two) times daily as needed for severe pain. Must last 30 days.    Dispense:  60 tablet    Refill:  0    Not a duplicate. Do NOT delete! Dispense 1 day early if closed on refill date. Avoid benzodiazepines within 8 hours of opioids. Do not send refill requests.   naloxone (NARCAN) nasal spray 4 mg/0.1 mL    Sig: Place 1 spray into the nose as needed for up to 365 doses (for opioid-induced respiratory depresssion). In case of emergency (overdose), spray once into each nostril. If no response within 3 minutes, repeat application and call [phone].    Dispense:  1 each    Refill:  0    Instruct patient in proper use of device.    Orders:  Orders  Placed This Encounter  Procedures   Compliance Drug Analysis, Ur    Volume: 30 ml(s). Minimum 3 ml of urine is needed. Document temperature of fresh sample. Indications: Long term (current) use  of opiate analgesic (Z79.891) Test#: [id number] (Comprehensive Profile)    Order Specific Question:   Release to patient    Answer:   Immediate    Follow-up plan:   Return in about 3 months (around 09/15/2021) for evaluation day (F2F) (MM).     Interventional Therapies  Risk  Complexity Considerations:   Estimated body mass index is 37.12 kg/m as calculated from the following:   Height as of this encounter: 5' 6"  (1.676 m).   Weight as of this encounter: 230 lb (104.3 kg). Anxiety and depression disorder  Allergy: Latex     Planned  Pending:   Pending further evaluation   Under consideration:   Diagnostic bilateral lumbar facet MBB  Diagnostic midline caudal ESI + epidurogram  Diagnostic bilateral IA shoulder joint injection  Diagnostic bilateral suprascapular nerve block  Diagnostic bilateral femoral and obturator nerve block  Diagnostic left genicular nerves block     Completed at other practices:   Failed Epidural Neurostimulator Implant (SCS)  Procedures done by Haskel Khan, MD: Therapeutic bilateral L3-4 facet joint injection x2 (08/02/2018, 11/08/2018)  Therapeutic bilateral L4 TFESI x4 (07/18/2013, 04/03/2014, 05/29/2014, 12/04/2014)  Therapeutic bilateral L5 TFESI x4 (07/18/2013, 04/03/2014, 05/29/2014, 12/04/2014)  Therapeutic right L4-5 and L5-S1 posterior hemilaminectomy with facetectomy and foraminotomy (02/01/2015)  Procedures done by Shirlyn Goltz, MD: Therapeutic right L3-5 medial branch RFA x1 (12/06/2020)  Therapeutic right L4, L5, S1 MBB x1 (11/12/2020) Therapeutic right L3, L4, L5 MBB x1 (10/10/2020)   Completed:   None at this time   Therapeutic  Palliative (PRN) options:   None established      Recent Visits Date Type Provider Dept  04/17/21 Office Visit  Milinda Pointer, MD [facility]  03/19/21 Office Visit Milinda Pointer, MD [facility]  Showing recent visits within past 90 days and meeting all other requirements Today's Visits Date Type Provider Dept  06/12/21 Office Visit Milinda Pointer, MD [facility]  Showing today's visits and meeting all other requirements Future Appointments No visits were found meeting these conditions. Showing future appointments within next 90 days and meeting all other requirements I discussed the assessment and treatment plan with the patient. The patient was provided an opportunity to ask questions and all were answered. The patient agreed with the plan and demonstrated an understanding of the instructions.  Patient advised to call back or seek an in-person evaluation if the symptoms or condition worsens.  Duration of encounter: 30 minutes.  Note by: Gaspar Cola, MD Date: 06/12/2021; Time: 1:53 PM

## 2021-06-12 ENCOUNTER — Encounter: Payer: Self-pay | Admitting: Pain Medicine

## 2021-06-12 ENCOUNTER — Other Ambulatory Visit: Payer: Self-pay

## 2021-06-12 ENCOUNTER — Ambulatory Visit: Payer: Medicare HMO | Attending: Pain Medicine | Admitting: Pain Medicine

## 2021-06-12 VITALS — BP 150/72 | HR 62 | Temp 97.2°F | Ht 65.0 in | Wt 230.0 lb

## 2021-06-12 DIAGNOSIS — M25511 Pain in right shoulder: Secondary | ICD-10-CM | POA: Diagnosis not present

## 2021-06-12 DIAGNOSIS — Z96643 Presence of artificial hip joint, bilateral: Secondary | ICD-10-CM | POA: Diagnosis present

## 2021-06-12 DIAGNOSIS — G8928 Other chronic postprocedural pain: Secondary | ICD-10-CM | POA: Insufficient documentation

## 2021-06-12 DIAGNOSIS — M25562 Pain in left knee: Secondary | ICD-10-CM | POA: Insufficient documentation

## 2021-06-12 DIAGNOSIS — G894 Chronic pain syndrome: Secondary | ICD-10-CM | POA: Insufficient documentation

## 2021-06-12 DIAGNOSIS — I129 Hypertensive chronic kidney disease with stage 1 through stage 4 chronic kidney disease, or unspecified chronic kidney disease: Secondary | ICD-10-CM | POA: Insufficient documentation

## 2021-06-12 DIAGNOSIS — Z79891 Long term (current) use of opiate analgesic: Secondary | ICD-10-CM

## 2021-06-12 DIAGNOSIS — M25512 Pain in left shoulder: Secondary | ICD-10-CM | POA: Insufficient documentation

## 2021-06-12 DIAGNOSIS — M79605 Pain in left leg: Secondary | ICD-10-CM | POA: Insufficient documentation

## 2021-06-12 DIAGNOSIS — M79604 Pain in right leg: Secondary | ICD-10-CM | POA: Diagnosis not present

## 2021-06-12 DIAGNOSIS — M545 Low back pain, unspecified: Secondary | ICD-10-CM | POA: Insufficient documentation

## 2021-06-12 DIAGNOSIS — G8929 Other chronic pain: Secondary | ICD-10-CM | POA: Diagnosis present

## 2021-06-12 DIAGNOSIS — M792 Neuralgia and neuritis, unspecified: Secondary | ICD-10-CM | POA: Diagnosis present

## 2021-06-12 DIAGNOSIS — M25559 Pain in unspecified hip: Secondary | ICD-10-CM

## 2021-06-12 DIAGNOSIS — Z79899 Other long term (current) drug therapy: Secondary | ICD-10-CM | POA: Insufficient documentation

## 2021-06-12 DIAGNOSIS — M25552 Pain in left hip: Secondary | ICD-10-CM

## 2021-06-12 MED ORDER — PREGABALIN 50 MG PO CAPS: 50.0000 mg | ORAL_CAPSULE | Freq: Every day | ORAL | 2 refills | Status: DC

## 2021-06-12 MED ORDER — OXYCODONE HCL 5 MG PO TABS: 5.0000 mg | ORAL_TABLET | Freq: Two times a day (BID) | ORAL | 0 refills | Status: DC | PRN

## 2021-06-12 MED ORDER — NALOXONE HCL 4 MG/0.1ML NA LIQD: 1.0000 | NASAL | 0 refills | Status: AC | PRN

## 2021-06-12 NOTE — Patient Instructions (Signed)
____________________________________________________________________________________________  Medication Rules  Purpose: To inform patients, and their family members, of our rules and regulations.  Applies to: All patients receiving prescriptions (written or electronic).  Pharmacy of record: Pharmacy where electronic prescriptions will be sent. If written prescriptions are taken to a different pharmacy, please inform the nursing staff. The pharmacy listed in the electronic medical record should be the one where you would like electronic prescriptions to be sent.  Electronic prescriptions: In compliance with the [place] Strengthen Opioid Misuse Prevention (STOP) Act of 2017 (Session Law 2017-74/H243), effective December 22, 2018, all controlled substances must be electronically prescribed. Calling prescriptions to the pharmacy will cease to exist.  Prescription refills: Only during scheduled appointments. Applies to all prescriptions.  NOTE: The following applies primarily to controlled substances (Opioid* Pain Medications).   Type of encounter (visit): For patients receiving controlled substances, face-to-face visits are required. (Not an option or up to the patient.)  Patient's responsibilities: Pain Pills: Bring all pain pills to every appointment (except for procedure appointments). Pill Bottles: Bring pills in original pharmacy bottle. Always bring the newest bottle. Bring bottle, even if empty. Medication refills: You are responsible for knowing and keeping track of what medications you take and those you need refilled. The day before your appointment: write a list of all prescriptions that need to be refilled. The day of the appointment: give the list to the admitting nurse. Prescriptions will be written only during appointments. No prescriptions will be written on procedure days. If you forget a medication: it will not be "Called in", "Faxed", or "electronically sent". You will  need to get another appointment to get these prescribed. No early refills. Do not call asking to have your prescription filled early. Prescription Accuracy: You are responsible for carefully inspecting your prescriptions before leaving our office. Have the discharge nurse carefully go over each prescription with you, before taking them home. Make sure that your name is accurately spelled, that your address is correct. Check the name and dose of your medication to make sure it is accurate. Check the number of pills, and the written instructions to make sure they are clear and accurate. Make sure that you are given enough medication to last until your next medication refill appointment. Taking Medication: Take medication as prescribed. When it comes to controlled substances, taking less pills or less frequently than prescribed is permitted and encouraged. Never take more pills than instructed. Never take medication more frequently than prescribed.  Inform other Doctors: Always inform, all of your healthcare providers, of all the medications you take. Pain Medication from other Providers: You are not allowed to accept any additional pain medication from any other Doctor or Healthcare provider. There are two exceptions to this rule. (see below) In the event that you require additional pain medication, you are responsible for notifying us, as stated below. Cough Medicine: Often these contain an opioid, such as codeine or hydrocodone. Never accept or take cough medicine containing these opioids if you are already taking an opioid* medication. The combination may cause respiratory failure and death. Medication Agreement: You are responsible for carefully reading and following our Medication Agreement. This must be signed before receiving any prescriptions from our practice. Safely store a copy of your signed Agreement. Violations to the Agreement will result in no further prescriptions. (Additional copies of our  Medication Agreement are available upon request.) Laws, Rules, & Regulations: All patients are expected to follow all Federal and State Laws, Statutes, Rules, & Regulations. Ignorance of   the Laws does not constitute a valid excuse.  Illegal drugs and Controlled Substances: The use of illegal substances (including, but not limited to marijuana and its derivatives) and/or the illegal use of any controlled substances is strictly prohibited. Violation of this rule may result in the immediate and permanent discontinuation of any and all prescriptions being written by our practice. The use of any illegal substances is prohibited. Adopted CDC guidelines & recommendations: Target dosing levels will be at or below 60 MME/day. Use of benzodiazepines** is not recommended.  Exceptions: There are only two exceptions to the rule of not receiving pain medications from other Healthcare Providers. Exception #1 (Emergencies): In the event of an emergency (i.e.: accident requiring emergency care), you are allowed to receive additional pain medication. However, you are responsible for: As soon as you are able, call our office [phone], at any time of the day or night, and leave a message stating your name, the date and nature of the emergency, and the name and dose of the medication prescribed. In the event that your call is answered by a member of our staff, make sure to document and save the date, time, and the name of the person that took your information.  Exception #2 (Planned Surgery): In the event that you are scheduled by another doctor or dentist to have any type of surgery or procedure, you are allowed (for a period no longer than 30 days), to receive additional pain medication, for the acute post-op pain. However, in this case, you are responsible for picking up a copy of our "Post-op Pain Management for Surgeons" handout, and giving it to your surgeon or dentist. This document is available at our office, and  does not require an appointment to obtain it. Simply go to our office during business hours (Monday-Thursday from 8:00 AM to 4:00 PM) (Friday 8:00 AM to 12:00 Noon) or if you have a scheduled appointment with us, prior to your surgery, and ask for it by name. In addition, you are responsible for: calling our office [phone], at any time of the day or night, and leaving a message stating your name, name of your surgeon, type of surgery, and date of procedure or surgery. Failure to comply with your responsibilities may result in termination of therapy involving the controlled substances.  *Opioid medications include: morphine, codeine, oxycodone, oxymorphone, hydrocodone, hydromorphone, meperidine, tramadol, tapentadol, buprenorphine, fentanyl, methadone. **Benzodiazepine medications include: diazepam (Valium), alprazolam (Xanax), clonazepam (Klonopine), lorazepam (Ativan), clorazepate (Tranxene), chlordiazepoxide (Librium), estazolam (Prosom), oxazepam (Serax), temazepam (Restoril), triazolam (Halcion) (Last updated: 11/19/2020) ____________________________________________________________________________________________  ____________________________________________________________________________________________  Medication Recommendations and Reminders  Applies to: All patients receiving prescriptions (written and/or electronic).  Medication Rules & Regulations: These rules and regulations exist for your safety and that of others. They are not flexible and neither are we. Dismissing or ignoring them will be considered "non-compliance" with medication therapy, resulting in complete and irreversible termination of such therapy. (See document titled "Medication Rules" for more details.) In all conscience, because of safety reasons, we cannot continue providing a therapy where the patient does not follow instructions.  Pharmacy of record:  Definition: This is the pharmacy where your electronic  prescriptions will be sent.  We do not endorse any particular pharmacy, however, we have experienced problems with [facility] not securing enough medication supply for the community. We do not restrict you in your choice of pharmacy. However, once we write for your prescriptions, we will NOT be re-sending more prescriptions to fix restricted supply problems   created by your pharmacy, or your insurance.  The pharmacy listed in the electronic medical record should be the one where you want electronic prescriptions to be sent. If you choose to change pharmacy, simply notify our nursing staff.  Recommendations: Keep all of your pain medications in a safe place, under lock and key, even if you live alone. We will NOT replace lost, stolen, or damaged medication. After you fill your prescription, take 1 week's worth of pills and put them away in a safe place. You should keep a separate, properly labeled bottle for this purpose. The remainder should be kept in the original bottle. Use this as your primary supply, until it runs out. Once it's gone, then you know that you have 1 week's worth of medicine, and it is time to come in for a prescription refill. If you do this correctly, it is unlikely that you will ever run out of medicine. To make sure that the above recommendation works, it is very important that you make sure your medication refill appointments are scheduled at least 1 week before you run out of medicine. To do this in an effective manner, make sure that you do not leave the office without scheduling your next medication management appointment. Always ask the nursing staff to show you in your prescription , when your medication will be running out. Then arrange for the receptionist to get you a return appointment, at least 7 days before you run out of medicine. Do not wait until you have 1 or 2 pills left, to come in. This is very poor planning and does not take into consideration that we may need to  cancel appointments due to bad weather, sickness, or emergencies affecting our staff. DO NOT ACCEPT A "Partial Fill": If for any reason your pharmacy does not have enough pills/tablets to completely fill or refill your prescription, do not allow for a "partial fill". The law allows the pharmacy to complete that prescription within 72 hours, without requiring a new prescription. If they do not fill the rest of your prescription within those 72 hours, you will need a separate prescription to fill the remaining amount, which we will NOT provide. If the reason for the partial fill is your insurance, you will need to talk to the pharmacist about payment alternatives for the remaining tablets, but again, DO NOT ACCEPT A PARTIAL FILL, unless you can trust your pharmacist to obtain the remainder of the pills within 72 hours.  Prescription refills and/or changes in medication(s):  Prescription refills, and/or changes in dose or medication, will be conducted only during scheduled medication management appointments. (Applies to both, written and electronic prescriptions.) No refills on procedure days. No medication will be changed or started on procedure days. No changes, adjustments, and/or refills will be conducted on a procedure day. Doing so will interfere with the diagnostic portion of the procedure. No phone refills. No medications will be "called into the pharmacy". No Fax refills. No weekend refills. No Holliday refills. No after hours refills.  Remember:  Business hours are:  Monday to Thursday 8:00 AM to 4:00 PM Provider's Schedule: Tej Murdaugh, MD - Appointments are:  Medication management: Monday and Wednesday 8:00 AM to 4:00 PM Procedure day: Tuesday and Thursday 7:30 AM to 4:00 PM Bilal Lateef, MD - Appointments are:  Medication management: Tuesday and Thursday 8:00 AM to 4:00 PM Procedure day: Monday and Wednesday 7:30 AM to 4:00 PM (Last update:  07/11/2020) ____________________________________________________________________________________________  ____________________________________________________________________________________________  CBD (cannabidiol) WARNING    Applicable to: All individuals currently taking or considering taking CBD (cannabidiol) and, more important, all patients taking opioid analgesic controlled substances (pain medication). (Example: oxycodone; oxymorphone; hydrocodone; hydromorphone; morphine; methadone; tramadol; tapentadol; fentanyl; buprenorphine; butorphanol; dextromethorphan; meperidine; codeine; etc.)  Legal status: CBD remains a Schedule I drug prohibited for any use. CBD is illegal with one exception. In the [place], CBD has a limited Food and Drug Administration (FDA) approval for the treatment of two specific types of epilepsy disorders. Only one CBD product has been approved by the FDA for this purpose: "Epidiolex". FDA is aware that some companies are marketing products containing cannabis and cannabis-derived compounds in ways that violate the Federal Food, Drug and Cosmetic Act (FD&C Act) and that may put the health and safety of consumers at risk. The FDA, a Federal agency, has not enforced the CBD status since 2018.   Legality: Some manufacturers ship CBD products nationally, which is illegal. Often such products are sold online and are therefore available throughout the country. CBD is openly sold in head shops and health food stores in some states where such sales have not been explicitly legalized. Selling unapproved products with unsubstantiated therapeutic claims is not only a violation of the law, but also can put patients at risk, as these products have not been proven to be safe or effective. Federal illegality makes it difficult to conduct research on CBD.  Reference: "FDA Regulation of Cannabis and Cannabis-Derived Products, Including Cannabidiol (CBD)" -  https://www.fda.gov/news-events/public-health-focus/fda-regulation-cannabis-and-cannabis-derived-products-including-cannabidiol-cbd  Warning: CBD is not FDA approved and has not undergo the same manufacturing controls as prescription drugs.  This means that the purity and safety of available CBD may be questionable. Most of the time, despite manufacturer's claims, it is contaminated with THC (delta-9-tetrahydrocannabinol - the chemical in marijuana responsible for the "HIGH").  When this is the case, the THC contaminant will trigger a positive urine drug screen (UDS) test for Marijuana (carboxy-THC). Because a positive UDS for any illicit substance is a violation of our medication agreement, your opioid analgesics (pain medicine) may be permanently discontinued.  MORE ABOUT CBD  General Information: CBD  is a derivative of the Marijuana (cannabis sativa) plant discovered in 1940. It is one of the 113 identified substances found in Marijuana. It accounts for up to 40% of the plant's extract. As of 2018, preliminary clinical studies on CBD included research for the treatment of anxiety, movement disorders, and pain. CBD is available and consumed in multiple forms, including inhalation of smoke or vapor, as an aerosol spray, and by mouth. It may be supplied as an oil containing CBD, capsules, dried cannabis, or as a liquid solution. CBD is thought not to be as psychoactive as THC (delta-9-tetrahydrocannabinol - the chemical in marijuana responsible for the "HIGH"). Studies suggest that CBD may interact with different biological target receptors in the body, including cannabinoid and other neurotransmitter receptors. As of 2018 the mechanism of action for its biological effects has not been determined.  Side-effects  Adverse reactions: Dry mouth, diarrhea, decreased appetite, fatigue, drowsiness, malaise, weakness, sleep disturbances, and others.  Drug interactions: CBC may interact with other medications  such as blood-thinners. (Last update: 07/28/2020) ____________________________________________________________________________________________  ____________________________________________________________________________________________  Drug Holidays (Slow)  What is a "Drug Holiday"? Drug Holiday: is the name given to the period of time during which a patient stops taking a medication(s) for the purpose of eliminating tolerance to the drug.  Benefits Improved effectiveness of opioids. Decreased opioid dose needed to achieve benefits. Improved pain with lesser dose.    What is tolerance? Tolerance: is the progressive decreased in effectiveness of a drug due to its repetitive use. With repetitive use, the body gets use to the medication and as a consequence, it loses its effectiveness. This is a common problem seen with opioid pain medications. As a result, a larger dose of the drug is needed to achieve the same effect that used to be obtained with a smaller dose.  How long should a "Drug Holiday" last? You should stay off of the pain medicine for at least 14 consecutive days. (2 weeks)  Should I stop the medicine "cold turkey"? No. You should always coordinate with your Pain Specialist so that he/she can provide you with the correct medication dose to make the transition as smoothly as possible.  How do I stop the medicine? Slowly. You will be instructed to decrease the daily amount of pills that you take by one (1) pill every seven (7) days. This is called a "slow downward taper" of your dose. For example: if you normally take four (4) pills per day, you will be asked to drop this dose to three (3) pills per day for seven (7) days, then to two (2) pills per day for seven (7) days, then to one (1) per day for seven (7) days, and at the end of those last seven (7) days, this is when the "Drug Holiday" would start.   Will I have withdrawals? By doing a "slow downward taper" like this one, it  is unlikely that you will experience any significant withdrawal symptoms. Typically, what triggers withdrawals is the sudden stop of a high dose opioid therapy. Withdrawals can usually be avoided by slowly decreasing the dose over a prolonged period of time. If you do not follow these instructions and decide to stop your medication abruptly, withdrawals may be possible.  What are withdrawals? Withdrawals: refers to the wide range of symptoms that occur after stopping or dramatically reducing opiate drugs after heavy and prolonged use. Withdrawal symptoms do not occur to patients that use low dose opioids, or those who take the medication sporadically. Contrary to benzodiazepine (example: Valium, Xanax, etc.) or alcohol withdrawals ("Delirium Tremens"), opioid withdrawals are not lethal. Withdrawals are the physical manifestation of the body getting rid of the excess receptors.  Expected Symptoms Early symptoms of withdrawal may include: Agitation Anxiety Muscle aches Increased tearing Insomnia Runny nose Sweating Yawning  Late symptoms of withdrawal may include: Abdominal cramping Diarrhea Dilated pupils Goose bumps Nausea Vomiting  Will I experience withdrawals? Due to the slow nature of the taper, it is very unlikely that you will experience any.  What is a slow taper? Taper: refers to the gradual decrease in dose.  (Last update: 07/11/2020) ____________________________________________________________________________________________    

## 2021-06-12 NOTE — Progress Notes (Signed)
Safety precautions to be maintained throughout the outpatient stay will include: orient to surroundings, keep bed in low position, maintain call bell within reach at all times, provide assistance with transfer out of bed and ambulation.  Nursing Pain Medication Assessment:  Safety precautions to be maintained throughout the outpatient stay will include: orient to surroundings, keep bed in low position, maintain call bell within reach at all times, provide assistance with transfer out of bed and ambulation.  Medication Inspection Compliance: Pill count conducted under aseptic conditions, in front of the patient. Neither the pills nor the bottle was removed from the patient's sight at any time. Once count was completed pills were immediately returned to the patient in their original bottle.  Medication: Oxycodone IR Pill/Patch Count:  13 of 60 pills remain Pill/Patch Appearance: Markings consistent with prescribed medication Bottle Appearance: Standard pharmacy container. Clearly labeled. Filled Date: 5 / 28 / 2022 Last Medication intake:  Yesterday
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
## 2021-06-19 ENCOUNTER — Telehealth (INDEPENDENT_AMBULATORY_CARE_PROVIDER_SITE_OTHER): Payer: Medicare HMO | Admitting: Psychiatry

## 2021-06-19 ENCOUNTER — Encounter: Payer: Self-pay | Admitting: Psychiatry

## 2021-06-19 ENCOUNTER — Other Ambulatory Visit: Payer: Self-pay

## 2021-06-19 DIAGNOSIS — F3341 Major depressive disorder, recurrent, in partial remission: Secondary | ICD-10-CM

## 2021-06-19 DIAGNOSIS — F419 Anxiety disorder, unspecified: Secondary | ICD-10-CM | POA: Diagnosis not present

## 2021-06-19 LAB — COMPLIANCE DRUG ANALYSIS, UR

## 2021-06-19 MED ORDER — DULOXETINE HCL 30 MG PO CPEP: 90.0000 mg | ORAL_CAPSULE | Freq: Every day | ORAL | 0 refills | Status: DC

## 2021-06-19 NOTE — Patient Instructions (Addendum)
1. Increase duloxetine 90 mg 2. Decrease 5 mg twice a day for one week, then discontinue  3. Next appointment: 8/9 at 1:40

## 2021-07-10 ENCOUNTER — Ambulatory Visit: Admit: 2021-07-10 | Payer: Medicare HMO | Admitting: Internal Medicine

## 2021-07-10 SURGERY — ESOPHAGOGASTRODUODENOSCOPY (EGD) WITH PROPOFOL
Anesthesia: General

## 2021-07-15 ENCOUNTER — Other Ambulatory Visit: Payer: Self-pay | Admitting: Pain Medicine

## 2021-07-15 DIAGNOSIS — E559 Vitamin D deficiency, unspecified: Secondary | ICD-10-CM
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
## 2021-07-30 ENCOUNTER — Telehealth (INDEPENDENT_AMBULATORY_CARE_PROVIDER_SITE_OTHER): Payer: Medicare HMO | Admitting: Psychiatry

## 2021-07-30 ENCOUNTER — Encounter: Payer: Self-pay | Admitting: Psychiatry

## 2021-07-30 ENCOUNTER — Other Ambulatory Visit: Payer: Self-pay

## 2021-07-30 DIAGNOSIS — F3341 Major depressive disorder, recurrent, in partial remission: Secondary | ICD-10-CM

## 2021-07-30 DIAGNOSIS — F419 Anxiety disorder, unspecified: Secondary | ICD-10-CM

## 2021-07-30 MED ORDER — DULOXETINE HCL 30 MG PO CPEP: 90.0000 mg | ORAL_CAPSULE | Freq: Every day | ORAL | 0 refills | Status: DC

## 2021-07-30 NOTE — Patient Instructions (Signed)
1. Continue duloxetine 90 mg 2. Next appointment: 11/28 at 11 AM   The next visit will be in person visit. Please arrive 15 mins before the scheduled time.   [facility]  Address: [address], [place], [place] [address]

## 2021-08-06 ENCOUNTER — Telehealth: Payer: Self-pay

## 2021-08-06 ENCOUNTER — Other Ambulatory Visit: Payer: Self-pay | Admitting: Psychiatry

## 2021-08-06 MED ORDER — DULOXETINE HCL 30 MG PO CPEP: ORAL_CAPSULE | ORAL | 0 refills | Status: DC

## 2021-08-06 MED ORDER — DULOXETINE HCL 60 MG PO CPEP: ORAL_CAPSULE | ORAL | 0 refills | Status: DC

## 2021-08-06 NOTE — Telephone Encounter (Signed)
Ordered. Could you contact the pharmacy, and cancel duloxetine 3 caps of 30 mg order? Thanks.

## 2021-08-06 NOTE — Telephone Encounter (Signed)
received fax that pt is requesting a refill on the duloxetine hcl dr. '60mg'$   and a '30mg'$ 

## 2021-08-09 NOTE — Telephone Encounter (Signed)
This was already done

## 2021-09-07 NOTE — Progress Notes (Signed)
PROVIDER NOTE: Information contained herein reflects review and annotations entered in association with encounter. Interpretation of such information and data should be left to medically-trained personnel. Information provided to patient can be located elsewhere in the medical record under "Patient Instructions". Document created using STT-dictation technology, any transcriptional errors that may result from process are unintentional.    Patient: Grace Zimmerman  Service Category: E/M  Provider: Gaspar Cola, MD  DOB: [birth year]  DOS: 09/11/2021  Specialty: Interventional Pain Management  MRN: [id number]  Setting: Ambulatory outpatient  PCP: Kirk Ruths, MD  Type: Established Patient    Referring Provider: Kirk Ruths, MD  Location: Office  Delivery: Face-to-face     HPI  Grace Zimmerman, a 75 y.o. year old female, is here today because of her Chronic bilateral low back pain without sciatica [M54.50, G89.29]. Grace Zimmerman's primary complain today is Back Pain (Lower/) Last encounter: My last encounter with her was on 07/15/2021. 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; Osteoarthritis; Osteoarthritis of carpometacarpal joint of right thumb; Spinal stenosis of lumbosacral region; Chronic pain syndrome; Abnormal MRI, lumbar spine (05/31/2020); Lumbar postlaminectomy syndrome; Lumbar Grade 1 Anterolisthesis of L3/L4 and L4/L5; Lumbosacral Grade 1 Anterolisthesis of L5/S1; Lumbar Grade 1 Retrolisthesis of L2/L3; Lumbar facet hypertrophy (Multilevel) (Bilateral); Lumbar central spinal stenosis, 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); Lumbar lateral recess  stenosis (Bilateral: L2-3) (Right: L3-4); Lumbar foraminal stenosis (Right: L2-3 L4-5) (Left: L5-S1) (Bilateral: L3-4); Annular tear of lumbar disc (Left: L4-5); Lumbosacral facet syndrome; DDD (degenerative disc disease), lumbosacral; Abnormal x-ray of lumbar spine (05/31/2020); Failed spinal cord stimulator; Chronic shoulder pain (2ry area of Pain) (Bilateral) (L>R); Chronic upper back pain (Bilateral); Failed back surgical syndrome; Chronic lower extremity pain (3ry area of Pain) (Bilateral) (R>L); Chronic hip pain (Bilateral) (R>L); Chronic knee pain (5th area of Pain) (Left); Chronic hip pain after THR (total hip replacement) (4th area of Pain) (Bilateral); Chronic knee pain (Bilateral) 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 Pain Assessment: Severity of Chronic pain is reported as a 1 /10. Location: Back Lower/Pt having clamping in her leg at night. Onset: More than a month ago. Quality: Aching, Cramping, Burning. Timing: Intermittent. Modifying factor(s): meds, laying down and sitting down. Vitals:  height is 5' 7"  (1.702 m) and weight is 237 lb (107.5 kg). Her temperature is 97 F (36.1 C) (abnormal). Her blood pressure is 150/60 (abnormal) and her pulse is 88. Her oxygen saturation is 100%.   Reason for encounter: medication management.   The patient indicates doing well with the current medication regimen. No adverse reactions or side effects reported to the medications.  The patient indicates today that her primary pain is that of the lower back (Bilateral) (R>L).  Physical exam is positive for bilateral lumbar facet arthralgia on provocative hyperextension or rotation maneuver and Kemp maneuver.  Today the patient comes in indicating that she took her pain medication in a manner other than prescribed.  She indicated having doubled up on the medication, without my consent.  Of course, this led to a very long  conversation regarding this issue and how if it ever happens again will immediately discontinue her medicines.  She  tried to justify what she did by saying that in the past she has taken a lot more medicine than what she is currently taking.  Again this led to a long conversation regarding what happens whenever you stop opioids for any given period of time.  Clearly, the use of opioids to manage this patient's pain is relatively contraindicated due to the fact that she does not follow commands.  Because of this, today we have decided to proceed with interventional therapies for the purpose of exactly identifying the etiology of the pain and hopefully treated in a way that will allow [place] to decrease, if not eliminate her opioids.  Surprisingly, the patient indicated that she has tried those before and they did not work.  I reminded the patient of our initial evaluation where I was very clear that I was not going to take her case for medication management only since I did not have enough space in my practice to continue doing that.  I was very clear to her that I would be managing her pain with interventional therapies and that her medications would be an adjuvant or a secondary option.  To review her prior history, we have that the patient has previously been seen and treated at [facility] Nada Libman, PA-C and Shirlyn Goltz, MD) Procedures done by Shirlyn Goltz, MD: Right L3-5 medial branch RFA (12/06/2020)  Right L4, L5, S1 MBB #2 (11/12/2020) Right L3, L4, L5 MBB #1 (10/10/2020) Neurosurgeon: Deetta Perla, MD and Haskel Khan, MD   According to the patient her primary area of pain is that of the lower back (Bilateral) (R>L).  She has had this problem for many years and she used to be a patient of Dr. Nila Nephew, who not only did a great deal of interventional therapies this patient, but he also did on 02/01/2015 a right-sided hemilaminectomy at L4-5 and L5-S1 with  microdiscectomy and posterior decompression with foraminotomy and facetectomy at both level.  The patient also indicates having had a spinal cord stimulator implant which did not work and was removed.  (Surprisingly, the patient did not remember that she had back surgery done.)   Procedures done by Haskel Khan, MD: Bilateral L4 TFESI (07/18/2013) Bilateral L5 TFESI (07/18/2013) Bilateral L4 TFESI (04/03/2014) Bilateral L5 TFESI (04/03/2014) Bilateral L4 TFESI (05/29/2014) Bilateral L5 TFESI (05/29/2014) Bilateral L4 TFESI (12/04/2014) Bilateral L5 TFESI (12/04/2014) Right L4-5 and L5-S1 posterior hemilaminectomy with facetectomy and foraminotomy (02/01/2015) Bilateral L3-4 facet joint injection (08/02/2018) Bilateral L3-4 facet joint injection (11/08/2018)   She also describes having had physical therapy and a recent lumbar MRI as well as flexion-extension views of the lumbar spine.  These revealed:   (05/31/2020) LUMBAR MRI FINDINGS:  Grade 1 Anterolisthesis of L3 on L4, L4 on L5, and L5 on S1 and trace Retrolisthesis of L2 on L3. There are mild endplate reactive changes at L3-L4 and L5-S1. No abnormal enhancement.    LEVELS: L1-2: Mild-moderate disc desiccation with small circumferential disc osteophyte complex, unchanged. No canal or foraminal stenosis.  L2-3: Mild disc desiccation with small right eccentric circumferential disc-osteophyte complex, unchanged. Together with bilateral facet hypertrophy, this results in unchanged narrowing of the bilateral lateral recesses as well as increased mild-moderate right foraminal narrowing.  L3-4: Mild disc desiccation with increased small circumferential disc bulge/pseudodisc. Together with severe bilateral facet hypertrophy and posterior epidural fat, this results in slightly increased moderate central canal stenosis and right lateral recess stenosis as well as unchanged mild bilateral  foraminal narrowing.  L4-5: Postsurgical changes status post right  hemilaminotomy. Increased moderate circumferential disc bulge/pseudodisc with left foraminal annular tear. Together with moderate bilateral facet hypertrophy, this results in increased mild right foraminal narrowing. No canal stenosis.  L5-S1: Postsurgical changes status post right hemilaminotomy. Small right eccentric circumferential disc bulge abuts the descending right S1 nerve roots. The large synovial cysts arising from the right facet joint seen on the prior exam have been resected. A tiny synovial cyst remains (series 9, image 32). Slightly increased mild left foraminal narrowing. No central canal stenosis or right foraminal narrowing.  Sacroiliac joints: Bilateral degenerative disease.  Soft tissues: There is soft tissue edema adjacent to the left L4-L5 and L5-S1 facet joints. Soft tissue edema adjacent to the right facet joints is likely postsurgical. Colonic diverticulosis.   IMPRESSION:  1. Slightly increased moderate central canal and right lateral recess stenosis at L3-4.  2. Increased mild-moderate right foraminal narrowing at L2-L3 and L4-L5 and slightly increased left foraminal narrowing at L5-S1.  3. A small right eccentric circumferential disc bulge abuts the descending right S1 nerve roots at L5-S1. However, the degree of stenosis has significantly improved status post hemilaminotomy.   (05/31/2020) LUMBAR X-RAYS FINDINGS:  Degenerative facet changes throughout the lumbar spine. There is grade 1 anterolisthesis of L4 on L5. This anterolisthesis is essentially stable between neutral and extension views but appears to slightly increase in degree on flexion views some of this appearance may be related to differences in angulation of the vertebral bodies due to positioning, however. Some degree of instability cannot be excluded. There is grade 1 anterolisthesis of L3 on L4 which is stable between flexion and extension. There is minimal grade 1 retrolisthesis of L2 on L3 which is also stable  flexion and extension. There is disc space narrowing at all levels of the lumbar spine.There are bilateral hip arthroplasties noted. There are endplate osteophytes at all levels of the lumbar spine.   IMPRESSION:  1. Multilevel degenerative disc disease and facet arthropathy throughout the lumbar spine with worsening disc space narrowing as compared to the prior studies.  2. Grade 1 anterolisthesis of L4 on L5 which appears slightly increased on flexion views relative to extension and neutral lateral views, suggestive of possible instability.  3. Minimal grade 1 anterolisthesis of L3 on L4 and minimal grade 1 retrolisthesis of L2 on L3 which are both stable between flexion and extension.  Based on today's evaluation, I have scheduled the patient to return for a diagnostic bilateral lumbar facet block.  RTCB: 12/18/2021 Nonopioids transferred 06/12/2021: Lyrica  Pharmacotherapy Assessment  Analgesic: Oxycodone IR 5 mg, 1 tab p.o. twice daily (10 mg/day of oxycodone) (15 MME/day) MME/day: 15 mg/day   Monitoring: [place] PMP: PDMP reviewed during this encounter.       Pharmacotherapy: No side-effects or adverse reactions reported. Compliance: No problems identified. Effectiveness: Clinically acceptable.  Chauncey Fischer, RN  09/11/2021  3:03 PM  Sign when Signing Visit Nursing Pain Medication Assessment:  Safety precautions to be maintained throughout the outpatient stay will include: orient to surroundings, keep bed in low position, maintain call bell within reach at all times, provide assistance with transfer out of bed and ambulation.  Medication Inspection Compliance: Pill count conducted under aseptic conditions, in front of the patient. Neither the pills nor the bottle was removed from the patient's sight at any time. Once count was completed pills were immediately returned to the patient in their original bottle.  Medication: Oxycodone IR Pill/Patch Count:  20 of 60 pills remain Pill/Patch  Appearance: Markings consistent with prescribed medication Bottle Appearance: Standard pharmacy container. Clearly labeled. Filled Date: 8 / 30 / 2022 Last Medication intake:  Today Safety precautions to be maintained throughout the outpatient stay will include: orient to surroundings, keep bed in low position, maintain call bell within reach at all times, provide assistance with transfer out of bed and ambulation.      UDS:  Summary  Date Value Ref Range Status  06/12/2021 Note  Final    Comment:    ==================================================================== Compliance Drug Analysis, Ur ==================================================================== Test                             Result       Flag       Units  Drug Present and Declared for Prescription Verification   Oxymorphone                    853          EXPECTED   ng/mg creat   Noroxycodone                   926          EXPECTED   ng/mg creat   Noroxymorphone                 219          EXPECTED   ng/mg creat    Oxymorphone, noroxycodone and noroxymorphone are expected    metabolites of oxycodone. Noroxymorphone is an expected metabolite    of oxymorphone. Sources of oxycodone and/or oxymorphone include    scheduled prescription medications.    Pregabalin                     PRESENT      EXPECTED   Trazodone                      PRESENT      EXPECTED   1,3 chlorophenyl piperazine    PRESENT      EXPECTED    1,3-chlorophenyl piperazine is an expected metabolite of trazodone.  Drug Absent but Declared for Prescription Verification   Oxycodone                      Not Detected UNEXPECTED ng/mg creat    Oxycodone is almost always present in patients taking this drug    consistently.  Absence of oxycodone could be due to lapse of time    since the last dose or unusual pharmacokinetics (rapid metabolism).    Duloxetine                     Not Detected  UNEXPECTED ==================================================================== Test                      Result    Flag   Units      Ref Range   Creatinine              47               mg/dL      >=20 ==================================================================== Declared Medications:  The flagging and interpretation on this report are based on the  following declared medications.  Unexpected results may arise from  inaccuracies in the declared medications.   **Note: The  testing scope of this panel includes these medications:   Duloxetine  Oxycodone  Pregabalin  Trazodone   **Note: The testing scope of this panel does not include the  following reported medications:   Buspirone  Naloxone  Olmesartan (Benicar)  Pantoprazole  Torsemide  Vitamin D2 ==================================================================== For clinical consultation, please call [phone]. ====================================================================      ROS  Constitutional: Denies any fever or chills Gastrointestinal: No reported hemesis, hematochezia, vomiting, or acute GI distress Musculoskeletal: Denies any acute onset joint swelling, redness, loss of ROM, or weakness Neurological: No reported episodes of acute onset apraxia, aphasia, dysarthria, agnosia, amnesia, paralysis, loss of coordination, or loss of consciousness  Medication Review  DULoxetine, amLODipine, ergocalciferol, linaclotide, naloxone, olmesartan, oxyCODONE, pantoprazole, pregabalin, torsemide, and traZODone  History Review  Allergy: Grace Zimmerman is allergic to latex. Drug: Grace Zimmerman  reports no history of drug use. Alcohol:  reports that she does not currently use alcohol. Tobacco:  reports that she has never smoked. She has never used smokeless tobacco. Social: Grace Zimmerman  reports that she has never smoked. She has never used smokeless tobacco. She reports that she does not currently use alcohol. She  reports that she does not use drugs. Medical:  has a past medical history of Anxiety, Depression, and Hypertension. Surgical: Grace Zimmerman  has a past surgical history that includes Joint replacement; Hand surgery (Bilateral); and Abdominal hysterectomy. Family: family history is not on file.  Laboratory Chemistry Profile   Renal Lab Results  Component Value Date   BUN 29 (H) 01/09/2021   CREATININE 1.60 (H) 01/09/2021   GFRNONAA 33 (L) 01/09/2021    Hepatic Lab Results  Component Value Date   AST 19 01/09/2021   ALT 14 01/09/2021   ALBUMIN 4.0 01/09/2021   ALKPHOS 84 01/09/2021    Electrolytes Lab Results  Component Value Date   NA 136 01/09/2021   K 3.9 01/09/2021   CL 101 01/09/2021   CALCIUM 8.9 01/09/2021   MG 2.6 (H) 03/13/2021    Bone Lab Results  Component Value Date   VD25OH 14.23 (L) 03/13/2021    Inflammation (CRP: Acute Phase) (ESR: Chronic Phase) Lab Results  Component Value Date   CRP 0.5 03/13/2021   ESRSEDRATE 28 03/13/2021         Note: Above Lab results reviewed.  Recent Imaging Review  DG Chest 1 View CLINICAL DATA:  Syncope  EXAM: CHEST  1 VIEW  COMPARISON:  None.  FINDINGS: The heart size and mediastinal contours are within normal limits. Both lungs are clear. The visualized skeletal structures are unremarkable.  IMPRESSION: No active disease.  Electronically Signed   By: Constance Holster M.D.   On: 01/09/2021 00:45 Note: Reviewed        Physical Exam  General appearance: Well nourished, well developed, and well hydrated. In no apparent acute distress Mental status: Alert, oriented x 3 (person, place, & time)       Respiratory: No evidence of acute respiratory distress Eyes: PERLA Vitals: BP (!) 150/60   Pulse 88   Temp (!) 97 F (36.1 C)   Ht 5' 7"  (1.702 m)   Wt 237 lb (107.5 kg)   SpO2 100%   BMI 37.12 kg/m  BMI: Estimated body mass index is 37.12 kg/m as calculated from the following:   Height as of this  encounter: 5' 7"  (1.702 m).   Weight as of this encounter: 237 lb (107.5 kg). Ideal: Ideal body weight: 61.6 kg (135 lb 12.9  oz) Adjusted ideal body weight: 80 kg (176 lb 4.5 oz)  Assessment   Status Diagnosis  Controlled Controlled Controlled 1. Chronic low back pain (1ry area of Pain) (Bilateral) (R>L) w/o sciatica   2. Chronic shoulder pain (2ry area of Pain) (Bilateral) (L>R)   3. Chronic lower extremity pain (3ry area of Pain) (Bilateral) (R>L)   4. Chronic hip pain after THR (total hip replacement) (4th area of Pain) (Bilateral)   5. Chronic knee pain (5th area of Pain) (Left)   6. 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 DDD (degenerative disc disease), lumbosacral   8. Failed back surgical syndrome   9. Failed spinal cord stimulator   10. Lumbar postlaminectomy syndrome   11. Chronic neuropathic pain   12. Chronic pain syndrome   13. Pharmacologic therapy   14. Chronic use of opiate for therapeutic purpose   15. Encounter for chronic pain management   16. Encounter for medication management   17. Lumbar facet hypertrophy (Multilevel) (Bilateral)   18. Lumbosacral facet syndrome      Updated Problems: No problems updated.   Plan of Care  Problem-specific:  No problem-specific Assessment & Plan notes found for this encounter.  Grace Zimmerman has a current medication list which includes the following long-term medication(s): amlodipine, duloxetine, duloxetine, duloxetine, linaclotide, olmesartan, oxycodone, pantoprazole, pregabalin, torsemide, trazodone, trazodone, ergocalciferol, and olmesartan.  Pharmacotherapy (Medications Ordered): Meds ordered this encounter  Medications   oxyCODONE (OXY IR/ROXICODONE) 5 MG immediate release tablet    Sig: Take 1 tablet (5 mg total) by mouth 2 (two) times daily as needed for severe pain. Must last 30 days.    Dispense:  60 tablet    Refill:  0    Not a duplicate. Do NOT  delete! Dispense 1 day early if closed on refill date. Avoid benzodiazepines within 8 hours of opioids. Do not send refill requests.   pregabalin (LYRICA) 50 MG capsule    Sig: Take 1 capsule (50 mg total) by mouth at bedtime.    Dispense:  30 capsule    Refill:  2    Fill one day early if pharmacy is closed on scheduled refill date. May substitute for generic if available.    Orders:  Orders Placed This Encounter  Procedures   LUMBAR FACET(MEDIAL BRANCH NERVE BLOCK) MBNB    Standing Status:   Future    Standing Expiration Date:   12/11/2021    Scheduling Instructions:     Procedure: Lumbar facet block (AKA.: Lumbosacral medial branch nerve block)     Side: Bilateral     Level: L3-4, L4-5, & L5-S1 Facets (L2, L3, L4, L5, & S1 Medial Branch Nerves)     Sedation: Patient's choice.     Timeframe: ASAA    Order Specific Question:   Where will this procedure be performed?    Answer:   [facility]    Follow-up plan:   Return for (Clinic) procedure: (B) L-FCT Blk #1, (Sed-anx).     Interventional Therapies  Risk  Complexity Considerations:   Estimated body mass index is 37.12 kg/m as calculated from the following:   Height as of this encounter: 5' 6"  (1.676 m).   Weight as of this encounter: 230 lb (104.3 kg). Anxiety and depression disorder  Allergy: Latex     Planned  Pending:   Pending further evaluation   Under consideration:   Diagnostic bilateral lumbar facet MBB  Diagnostic midline  caudal ESI + epidurogram  Diagnostic bilateral IA shoulder joint injection  Diagnostic bilateral suprascapular nerve block  Diagnostic bilateral femoral and obturator nerve block  Diagnostic left genicular nerves block     Completed at other practices:   Failed Epidural Neurostimulator Implant (SCS)  Procedures done by Haskel Khan, MD: Therapeutic bilateral L3-4 facet joint injection x2 (08/02/2018, 11/08/2018)  Therapeutic bilateral L4 TFESI x4 (07/18/2013, 04/03/2014, 05/29/2014,  12/04/2014)  Therapeutic bilateral L5 TFESI x4 (07/18/2013, 04/03/2014, 05/29/2014, 12/04/2014)  Therapeutic right L4-5 and L5-S1 posterior hemilaminectomy with facetectomy and foraminotomy (02/01/2015)  Procedures done by Shirlyn Goltz, MD: Therapeutic right L3-5 medial branch RFA x1 (12/06/2020)  Therapeutic right L4, L5, S1 MBB x1 (11/12/2020) Therapeutic right L3, L4, L5 MBB x1 (10/10/2020)   Completed:   None at this time   Therapeutic  Palliative (PRN) options:   None established    Recent Visits Date Type Provider Dept  09/11/21 Office Visit Milinda Pointer, MD [facility]  Showing recent visits within past 90 days and meeting all other requirements Future Appointments Date Type Provider Dept  09/26/21 Appointment Milinda Pointer, MD [facility]  10/21/21 Appointment Milinda Pointer, MD [facility]  Showing future appointments within next 90 days and meeting all other requirements I discussed the assessment and treatment plan with the patient. The patient was provided an opportunity to ask questions and all were answered. The patient agreed with the plan and demonstrated an understanding of the instructions.  Patient advised to call back or seek an in-person evaluation if the symptoms or condition worsens.  Duration of encounter: 46 minutes.  Note by: Gaspar Cola, MD Date: 09/11/2021; Time: 2:11 PM

## 2021-09-11 ENCOUNTER — Ambulatory Visit: Payer: Medicare HMO | Attending: Pain Medicine | Admitting: Pain Medicine

## 2021-09-11 ENCOUNTER — Encounter: Payer: Self-pay | Admitting: Pain Medicine

## 2021-09-11 ENCOUNTER — Other Ambulatory Visit: Payer: Self-pay

## 2021-09-11 VITALS — BP 150/60 | HR 88 | Temp 97.0°F | Ht 67.0 in | Wt 237.0 lb

## 2021-09-11 DIAGNOSIS — M792 Neuralgia and neuritis, unspecified: Secondary | ICD-10-CM

## 2021-09-11 DIAGNOSIS — M25559 Pain in unspecified hip: Secondary | ICD-10-CM | POA: Diagnosis not present

## 2021-09-11 DIAGNOSIS — M25511 Pain in right shoulder: Secondary | ICD-10-CM | POA: Diagnosis not present

## 2021-09-11 DIAGNOSIS — M25562 Pain in left knee: Secondary | ICD-10-CM

## 2021-09-11 DIAGNOSIS — M5137 Other intervertebral disc degeneration, lumbosacral region: Secondary | ICD-10-CM

## 2021-09-11 DIAGNOSIS — M545 Low back pain, unspecified: Secondary | ICD-10-CM

## 2021-09-11 DIAGNOSIS — G8929 Other chronic pain: Secondary | ICD-10-CM

## 2021-09-11 DIAGNOSIS — M79604 Pain in right leg: Secondary | ICD-10-CM

## 2021-09-11 DIAGNOSIS — M47816 Spondylosis without myelopathy or radiculopathy, lumbar region: Secondary | ICD-10-CM

## 2021-09-11 DIAGNOSIS — G8928 Other chronic postprocedural pain: Secondary | ICD-10-CM

## 2021-09-11 DIAGNOSIS — M79605 Pain in left leg: Secondary | ICD-10-CM

## 2021-09-11 DIAGNOSIS — T85192S Other mechanical complication of implanted electronic neurostimulator (electrode) of spinal cord, sequela: Secondary | ICD-10-CM

## 2021-09-11 DIAGNOSIS — M25552 Pain in left hip: Secondary | ICD-10-CM

## 2021-09-11 DIAGNOSIS — G894 Chronic pain syndrome: Secondary | ICD-10-CM

## 2021-09-11 DIAGNOSIS — Z79899 Other long term (current) drug therapy: Secondary | ICD-10-CM

## 2021-09-11 DIAGNOSIS — Z96643 Presence of artificial hip joint, bilateral: Secondary | ICD-10-CM

## 2021-09-11 DIAGNOSIS — M25561 Pain in right knee: Secondary | ICD-10-CM

## 2021-09-11 DIAGNOSIS — M25512 Pain in left shoulder: Secondary | ICD-10-CM

## 2021-09-11 DIAGNOSIS — Z96653 Presence of artificial knee joint, bilateral: Secondary | ICD-10-CM

## 2021-09-11 DIAGNOSIS — M47817 Spondylosis without myelopathy or radiculopathy, lumbosacral region: Secondary | ICD-10-CM

## 2021-09-11 DIAGNOSIS — Z79891 Long term (current) use of opiate analgesic: Secondary | ICD-10-CM

## 2021-09-11 DIAGNOSIS — M961 Postlaminectomy syndrome, not elsewhere classified: Secondary | ICD-10-CM

## 2021-09-11 DIAGNOSIS — M51379 Other intervertebral disc degeneration, lumbosacral region without mention of lumbar back pain or lower extremity pain: Secondary | ICD-10-CM

## 2021-09-11 MED ORDER — OXYCODONE HCL 5 MG PO TABS: 5.0000 mg | ORAL_TABLET | Freq: Two times a day (BID) | ORAL | 0 refills | Status: DC | PRN

## 2021-09-11 MED ORDER — PREGABALIN 50 MG PO CAPS: 50.0000 mg | ORAL_CAPSULE | Freq: Every day | ORAL | 2 refills | Status: DC

## 2021-09-11 NOTE — Progress Notes (Signed)
Nursing Pain Medication Assessment:  Safety precautions to be maintained throughout the outpatient stay will include: orient to surroundings, keep bed in low position, maintain call bell within reach at all times, provide assistance with transfer out of bed and ambulation.  Medication Inspection Compliance: Pill count conducted under aseptic conditions, in front of the patient. Neither the pills nor the bottle was removed from the patient's sight at any time. Once count was completed pills were immediately returned to the patient in their original bottle.  Medication: Oxycodone IR Pill/Patch Count:  20 of 60 pills remain Pill/Patch Appearance: Markings consistent with prescribed medication Bottle Appearance: Standard pharmacy container. Clearly labeled. Filled Date: 8 / 30 / 2022 Last Medication intake:  Today Safety precautions to be maintained throughout the outpatient stay will include: orient to surroundings, keep bed in low position, maintain call bell within reach at all times, provide assistance with transfer out of bed and ambulation.

## 2021-09-11 NOTE — Patient Instructions (Signed)
______________________________________________________________________  Preparing for Procedure with Sedation  NOTICE: Due to recent regulatory changes, starting on July 22, 2021, procedures requiring intravenous (IV) sedation will no longer be performed at the [facility].  These types of procedures are required to be performed at [facility] ambulatory surgery facility.  We are very sorry for the inconvenience.  Procedure appointments are limited to planned procedures: No Prescription Refills. No disability issues will be discussed. No medication changes will be discussed.  Instructions: Oral Intake: Do not eat or drink anything for at least 8 hours prior to your procedure. (Exception: Blood Pressure Medication. See below.) Transportation: A driver is required. You may not drive yourself after the procedure. Blood Pressure Medicine: Do not forget to take your blood pressure medicine with a sip of water the morning of the procedure. If your Diastolic (lower reading) is above 100 mmHg, elective cases will be cancelled/rescheduled. Blood thinners: These will need to be stopped for procedures. Notify our staff if you are taking any blood thinners. Depending on which one you take, there will be specific instructions on how and when to stop it. Diabetics on insulin: Notify the staff so that you can be scheduled 1st case in the morning. If your diabetes requires high dose insulin, take only  of your normal insulin dose the morning of the procedure and notify the staff that you have done so. Preventing infections: Shower with an antibacterial soap the morning of your procedure. Build-up your immune system: Take 1000 mg of Vitamin C with every meal (3 times a day) the day prior to your procedure. Antibiotics: Inform the staff if you have a condition or reason that requires you to take antibiotics before dental procedures. Pregnancy: If you are pregnant, call and cancel the procedure. Sickness: If  you have a cold, fever, or any active infections, call and cancel the procedure. Arrival: You must be in the facility at least 30 minutes prior to your scheduled procedure. Children: Do not bring children with you. Dress appropriately: Bring dark clothing that you would not mind if they get stained. Valuables: Do not bring any jewelry or valuables.  Reasons to call and reschedule or cancel your procedure: (Following these recommendations will minimize the risk of a serious complication.) Surgeries: Avoid having procedures within 2 weeks of any surgery. (Avoid for 2 weeks before or after any surgery). Flu Shots: Avoid having procedures within 2 weeks of a flu shots. (Avoid for 2 weeks before or after immunizations). Barium: Avoid having a procedure within 7-10 days after having had a radiological study involving the use of radiological contrast. (Myelograms, Barium swallow or enema study). Heart attacks: Avoid any elective procedures or surgeries for the initial 6 months after a "Myocardial Infarction" (Heart Attack). Blood thinners: It is imperative that you stop these medications before procedures. Let us know if you if you take any blood thinner.  Infection: Avoid procedures during or within two weeks of an infection (including chest colds or gastrointestinal problems). Symptoms associated with infections include: Localized redness, fever, chills, night sweats or profuse sweating, burning sensation when voiding, cough, congestion, stuffiness, runny nose, sore throat, diarrhea, nausea, vomiting, cold or Flu symptoms, recent or current infections. It is specially important if the infection is over the area that we intend to treat. Heart and lung problems: Symptoms that may suggest an active cardiopulmonary problem include: cough, chest pain, breathing difficulties or shortness of breath, dizziness, ankle swelling, uncontrolled high or unusually low blood pressure, and/or palpitations. If you are  experiencing any of these symptoms, cancel your procedure and contact your primary care physician for an evaluation.  Remember:  Regular Business hours are:  Monday to Thursday 8:00 AM to 4:00 PM  Provider's Schedule: Francisco Naveira, MD:  Procedure days: Tuesday and Thursday 7:30 AM to 4:00 PM  Bilal Lateef, MD:  Procedure days: Monday and Wednesday 7:30 AM to 4:00 PM ______________________________________________________________________  Facet Joint Block The facet joints connect the bones of the spine (vertebrae). They make it possible for you to bend, twist, and make other movements with your spine. They also keep you from bending too far, twisting too far, and making other extreme movements. A facet joint block is a procedure in which a numbing medicine (anesthetic) is injected into a facet joint. In many cases, an anti-inflammatory medicine (steroid) is also injected. A facet joint block may be done: To diagnose neck or back pain. If the pain gets better after a facet joint block, it means the pain is probably coming from the facet joint. If the pain does not get better, it means the pain is probably not coming from the facet joint. To relieve neck or back pain that is caused by an inflamed facet joint. A facet joint block is only done to relieve pain if the pain does not improve with other methods, such as medicine, exercise programs, and physical therapy. Tell a health care provider about: Any allergies you have. All medicines you are taking, including vitamins, herbs, eye drops, creams, and over-the-counter medicines. Any problems you or family members have had with anesthetic medicines. Any blood disorders you have. Any surgeries you have had. Any medical conditions you have or have had. Whether you are pregnant or may be pregnant. What are the risks? Generally, this is a safe procedure. However, problems may occur, including: Bleeding. Injury to a nerve near the injection  site. Pain at the injection site. Weakness or numbness in areas controlled by nerves near the injection site. Infection. Temporary fluid retention. Allergic reactions to medicines or dyes. Injury to other structures or organs near the injection site. What happens before the procedure? Medicines Ask your health care provider about: Changing or stopping your regular medicines. This is especially important if you are taking diabetes medicines or blood thinners. Taking medicines such as aspirin and ibuprofen. These medicines can thin your blood. Do not take these medicines unless your health care provider tells you to take them. Taking over-the-counter medicines, vitamins, herbs, and supplements. Eating and drinking Follow instructions from your health care provider about eating and drinking, which may include: 8 hours before the procedure - stop eating heavy meals or foods, such as meat, fried foods, or fatty foods. 6 hours before the procedure - stop eating light meals or foods, such as toast or cereal. 6 hours before the procedure - stop drinking milk or drinks that contain milk. 2 hours before the procedure - stop drinking clear liquids. Staying hydrated Follow instructions from your health care provider about hydration, which may include: Up to 2 hours before the procedure - you may continue to drink clear liquids, such as water, clear fruit juice, black coffee, and plain tea. General instructions Do not use any products that contain nicotine or tobacco for at least 4-6 weeks before the procedure. These products include cigarettes, e-cigarettes, and chewing tobacco. If you need help quitting, ask your health care provider. Plan to have someone take you home from the hospital or clinic. Ask your health care provider: How your surgery   site will be marked. What steps will be taken to help prevent infection. These may include: Removing hair at the surgery site. Washing skin with a  germ-killing soap. Receiving antibiotic medicine. What happens during the procedure?  You will put on a hospital gown. You will lie on your stomach on an X-ray table. You may be asked to lie in a different position if an injection will be made in your neck. Machines will be used to monitor your oxygen levels, heart rate, and blood pressure. Your skin will be cleaned. If an injection will be made in your neck, an IV will be inserted into one of your veins. Fluids and medicine will flow directly into your body through the IV. A numbing medicine (local anesthetic) will be applied to your skin. Your skin may sting or burn for a moment. A video X-ray machine (fluoroscopy) will be used to find the joint. In some cases, a CT scan may be used. A contrast dye may be injected into the facet joint area to help find the joint. When the joint is located, an anesthetic will be injected into the joint through the needle. Your health care provider will ask you whether you feel pain relief. If you feel relief, a steroid may be injected to provide pain relief for a longer period of time. If you do not feel relief or feel only partial relief, additional injections of an anesthetic may be made in other facet joints. The needle will be removed. Your skin will be cleaned. A bandage (dressing) will be applied over each injection site. The procedure may vary among health care providers and hospitals. What happens after the procedure? Your blood pressure, heart rate, breathing rate, and blood oxygen level will be monitored until you leave the hospital or clinic. You will lie down and rest for a period of time. Summary A facet joint block is a procedure in which a numbing medicine (anesthetic) is injected into a facet joint. An anti-inflammatory medicine (stereoid) may also be injected. Follow instructions from your health care provider about medicines and eating and drinking before the procedure. Do not use any  products that contain nicotine or tobacco for at least 4-6 weeks before the procedure. You will lie on your stomach for the procedure, but you may be asked to lie in a different position if an injection will be made in your neck. When the joint is located, an anesthetic will be injected into the joint through the needle. This information is not intended to replace advice given to you by your health care provider. Make sure you discuss any questions you have with your health care provider. Document Revised: 03/31/2019 Document Reviewed: 11/12/2018 [employer] Patient Education  2022 [employer].

## 2021-09-24 ENCOUNTER — Telehealth: Payer: Self-pay

## 2021-09-24 NOTE — Telephone Encounter (Signed)
Attempted to call patient, message left. 

## 2021-09-24 NOTE — Telephone Encounter (Signed)
Pt fell on yesterday and is scheduled to get shots on Thursday and isnt sure of she should keep her appt or r/s and would like a nurse call to advise her on what to do next

## 2021-09-25 NOTE — Telephone Encounter (Signed)
States now has a cold and has already cancelled tomorrow's appt.

## 2021-09-26 ENCOUNTER — Ambulatory Visit: Payer: Medicare HMO | Admitting: Pain Medicine

## 2021-10-03 ENCOUNTER — Ambulatory Visit
Admission: RE | Admit: 2021-10-03 | Discharge: 2021-10-03 | Disposition: A | Discharge status: Home / Self Care | Payer: Medicare HMO | Source: Ambulatory Visit | Attending: Pain Medicine | Admitting: Pain Medicine

## 2021-10-03 ENCOUNTER — Encounter: Payer: Self-pay | Admitting: Pain Medicine

## 2021-10-03 ENCOUNTER — Other Ambulatory Visit: Payer: Self-pay

## 2021-10-03 ENCOUNTER — Ambulatory Visit (HOSPITAL_BASED_OUTPATIENT_CLINIC_OR_DEPARTMENT_OTHER): Payer: Medicare HMO | Admitting: Pain Medicine

## 2021-10-03 VITALS — BP 110/68 | HR 65 | Temp 97.2°F | Resp 16 | Ht 67.0 in | Wt 235.0 lb

## 2021-10-03 DIAGNOSIS — M545 Low back pain, unspecified: Secondary | ICD-10-CM | POA: Insufficient documentation

## 2021-10-03 DIAGNOSIS — M4316 Spondylolisthesis, lumbar region: Secondary | ICD-10-CM | POA: Diagnosis not present

## 2021-10-03 DIAGNOSIS — G8929 Other chronic pain: Secondary | ICD-10-CM | POA: Insufficient documentation

## 2021-10-03 DIAGNOSIS — Z91040 Latex allergy status: Secondary | ICD-10-CM | POA: Insufficient documentation

## 2021-10-03 DIAGNOSIS — M5137 Other intervertebral disc degeneration, lumbosacral region: Secondary | ICD-10-CM | POA: Insufficient documentation

## 2021-10-03 DIAGNOSIS — M4310 Spondylolisthesis, site unspecified: Secondary | ICD-10-CM | POA: Insufficient documentation

## 2021-10-03 DIAGNOSIS — M47816 Spondylosis without myelopathy or radiculopathy, lumbar region: Secondary | ICD-10-CM | POA: Diagnosis not present

## 2021-10-03 DIAGNOSIS — M961 Postlaminectomy syndrome, not elsewhere classified: Secondary | ICD-10-CM | POA: Insufficient documentation

## 2021-10-03 DIAGNOSIS — Z5189 Encounter for other specified aftercare: Secondary | ICD-10-CM | POA: Diagnosis not present

## 2021-10-03 DIAGNOSIS — M47817 Spondylosis without myelopathy or radiculopathy, lumbosacral region: Secondary | ICD-10-CM | POA: Insufficient documentation

## 2021-10-03 MED ORDER — ROPIVACAINE HCL 2 MG/ML IJ SOLN
INTRAMUSCULAR | Status: AC
  Filled 2021-10-03: qty 20

## 2021-10-03 MED ORDER — TRIAMCINOLONE ACETONIDE 40 MG/ML IJ SUSP
80.0000 mg | Freq: Once | INTRAMUSCULAR | Status: AC
  Administered 2021-10-03: 80 mg

## 2021-10-03 MED ORDER — MIDAZOLAM HCL 5 MG/5ML IJ SOLN
INTRAMUSCULAR | Status: AC
  Filled 2021-10-03: qty 5

## 2021-10-03 MED ORDER — TRIAMCINOLONE ACETONIDE 40 MG/ML IJ SUSP
INTRAMUSCULAR | Status: AC
  Filled 2021-10-03: qty 1

## 2021-10-03 MED ORDER — ROPIVACAINE HCL 2 MG/ML IJ SOLN
18.0000 mL | Freq: Once | INTRAMUSCULAR | Status: AC
  Administered 2021-10-03: 18 mL via PERINEURAL

## 2021-10-03 MED ORDER — PENTAFLUOROPROP-TETRAFLUOROETH EX AERO
INHALATION_SPRAY | Freq: Once | CUTANEOUS | Status: AC
Start: 2021-10-03 — End: 2021-10-03
  Administered 2021-10-03: 30 via TOPICAL
  Filled 2021-10-03: qty 116

## 2021-10-03 MED ORDER — LACTATED RINGERS IV SOLN
1000.0000 mL | Freq: Once | INTRAVENOUS | Status: AC
  Administered 2021-10-03: 1000 mL via INTRAVENOUS

## 2021-10-03 MED ORDER — MIDAZOLAM HCL 5 MG/5ML IJ SOLN
0.5000 mg | Freq: Once | INTRAMUSCULAR | Status: AC
  Administered 2021-10-03: 2 mg via INTRAVENOUS

## 2021-10-03 MED ORDER — LIDOCAINE HCL 2 % IJ SOLN
20.0000 mL | Freq: Once | INTRAMUSCULAR | Status: AC
Start: 2021-10-03 — End: 2021-10-03
  Administered 2021-10-03: 400 mg

## 2021-10-03 MED ORDER — LIDOCAINE HCL 2 % IJ SOLN
INTRAMUSCULAR | Status: AC
  Filled 2021-10-03: qty 20

## 2021-10-03 NOTE — Progress Notes (Signed)
PROVIDER NOTE: Information contained herein reflects review and annotations entered in association with encounter. Interpretation of such information and data should be left to medically-trained personnel. Information provided to patient can be located elsewhere in the medical record under "Patient Instructions". Document created using STT-dictation technology, any transcriptional errors that may result from process are unintentional.    Patient: Grace Zimmerman  Service Category: Procedure  Provider: Gaspar Cola, MD  DOB: 02/16/1946  DOS: 10/03/2021  Location: [facility]  MRN: [id number]  Setting: Ambulatory - outpatient  Referring Provider: Milinda Pointer, MD  Type: Established Patient  Specialty: Interventional Pain Management  PCP: Kirk Ruths, MD   Primary Reason for Visit: Interventional Pain Management Treatment. CC: Back Pain (lower)    Procedure:          Anesthesia, Analgesia, Anxiolysis:  Type: Lumbar Facet, Medial Branch Block(s) #1  Primary Purpose: Diagnostic Region: Posterolateral Lumbosacral Spine Level: L2, L3, L4, L5, & S1 Medial Branch Level(s). Injecting these levels blocks the L3-4, L4-5, and L5-S1 lumbar facet joints. Laterality: Bilateral  Type: Local Anesthesia Local Anesthetic: Lidocaine 1-2% Sedation: Minimal Anxiolysis  Indication(s): Anxiety & Analgesia Route: Infiltration ([place]/IM) IV Access: Available   Position: Prone   Indications: 1. Lumbosacral facet syndrome   2. Spondylosis without myelopathy or radiculopathy, lumbosacral region   3. Lumbar facet hypertrophy (Multilevel) (Bilateral)   4. DDD (degenerative disc disease), lumbosacral   5. Lumbar Grade 1 Retrolisthesis of L2/L3   6. 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 Chronic low back pain (1ry area of Pain) (Bilateral) (R>L) w/o sciatica   8. Failed back surgical syndrome   9. Encounter for therapeutic procedure   10. Latex precautions,  history of latex allergy    Pain Score: Pre-procedure: 5 /10 Post-procedure: 0-No pain/10     Pre-op H&P Assessment:  Ms. Neuroth is a 75 y.o. (year old), female patient, seen today for interventional treatment. She  has a past surgical history that includes Joint replacement; Hand surgery (Bilateral); and Abdominal hysterectomy. Ms. Tonkinson has a current medication list which includes the following prescription(s): amlodipine, duloxetine, duloxetine, duloxetine, linaclotide, naloxone, olmesartan, oxycodone, pantoprazole, pregabalin, torsemide, trazodone, trazodone, ergocalciferol, and olmesartan. Her primarily concern today is the Back Pain (lower)  Initial Vital Signs:  Pulse/HCG Rate: 70  Temp: (!) 96.6 F (35.9 C) Resp: 16 BP: (!) 142/63 SpO2: 97 %  BMI: Estimated body mass index is 36.81 kg/m as calculated from the following:   Height as of this encounter: 5\' 7"  (1.702 m).   Weight as of this encounter: 235 lb (106.6 kg).  Risk Assessment: Allergies: Reviewed. She is allergic to latex.  Allergy Precautions: Latex-free protocol activated Coagulopathies: Reviewed. None identified.  Blood-thinner therapy: None at this time Active Infection(s): Reviewed. None identified. Ms. Popwell is afebrile  Site Confirmation: Ms. Benedict was asked to confirm the procedure and laterality before marking the site Procedure checklist: Completed Consent: Before the procedure and under the influence of no sedative(s), amnesic(s), or anxiolytics, the patient was informed of the treatment options, risks and possible complications. To fulfill our ethical and legal obligations, as recommended by the American Medical Association's Code of Ethics, I have informed the patient of my clinical impression; the nature and purpose of the treatment or procedure; the risks, benefits, and possible complications of the intervention; the alternatives, including doing nothing; the risk(s) and benefit(s) of the  alternative treatment(s) or procedure(s); and the risk(s) and benefit(s) of doing nothing. The  patient was provided information about the general risks and possible complications associated with the procedure. These may include, but are not limited to: failure to achieve desired goals, infection, bleeding, organ or nerve damage, allergic reactions, paralysis, and death. In addition, the patient was informed of those risks and complications associated to Spine-related procedures, such as failure to decrease pain; infection (i.e.: Meningitis, epidural or intraspinal abscess); bleeding (i.e.: epidural hematoma, subarachnoid hemorrhage, or any other type of intraspinal or peri-dural bleeding); organ or nerve damage (i.e.: Any type of peripheral nerve, nerve root, or spinal cord injury) with subsequent damage to sensory, motor, and/or autonomic systems, resulting in permanent pain, numbness, and/or weakness of one or several areas of the body; allergic reactions; (i.e.: anaphylactic reaction); and/or death. Furthermore, the patient was informed of those risks and complications associated with the medications. These include, but are not limited to: allergic reactions (i.e.: anaphylactic or anaphylactoid reaction(s)); adrenal axis suppression; blood sugar elevation that in diabetics may result in ketoacidosis or comma; water retention that in patients with history of congestive heart failure may result in shortness of breath, pulmonary edema, and decompensation with resultant heart failure; weight gain; swelling or edema; medication-induced neural toxicity; particulate matter embolism and blood vessel occlusion with resultant organ, and/or nervous system infarction; and/or aseptic necrosis of one or more joints. Finally, the patient was informed that Medicine is not an exact science; therefore, there is also the possibility of unforeseen or unpredictable risks and/or possible complications that may result in a  catastrophic outcome. The patient indicated having understood very clearly. We have given the patient no guarantees and we have made no promises. Enough time was given to the patient to ask questions, all of which were answered to the patient's satisfaction. Ms. Proia has indicated that she wanted to continue with the procedure. Attestation: I, the ordering provider, attest that I have discussed with the patient the benefits, risks, side-effects, alternatives, likelihood of achieving goals, and potential problems during recovery for the procedure that I have provided informed consent. Date  Time: 10/03/2021  8:36 AM  Pre-Procedure Preparation:  Monitoring: As per clinic protocol. Respiration, ETCO2, SpO2, BP, heart rate and rhythm monitor placed and checked for adequate function Safety Precautions: Patient was assessed for positional comfort and pressure points before starting the procedure. Time-out: I initiated and conducted the "Time-out" before starting the procedure, as per protocol. The patient was asked to participate by confirming the accuracy of the "Time Out" information. Verification of the correct person, site, and procedure were performed and confirmed by me, the nursing staff, and the patient. "Time-out" conducted as per Joint Commission's Universal Protocol (UP.01.01.01). Time: 0908  Description of Procedure:          Laterality: Bilateral. The procedure was performed in identical fashion on both sides. Levels:  L2, L3, L4, L5, & S1 Medial Branch Level(s) Area Prepped: Posterior Lumbosacral Region DuraPrep (Iodine Povacrylex [0.7% available iodine] and Isopropyl Alcohol, 74% w/w) Safety Precautions: Aspiration looking for blood return was conducted prior to all injections. At no point did we inject any substances, as a needle was being advanced. Before injecting, the patient was told to immediately notify me if she was experiencing any new onset of "ringing in the ears, or metallic  taste in the mouth". No attempts were made at seeking any paresthesias. Safe injection practices and needle disposal techniques used. Medications properly checked for expiration dates. SDV (single dose vial) medications used. After the completion of the procedure, all disposable equipment used  was discarded in the proper designated medical waste containers. Local Anesthesia: Protocol guidelines were followed. The patient was positioned over the fluoroscopy table. The area was prepped in the usual manner. The time-out was completed. The target area was identified using fluoroscopy. A 12-in long, straight, sterile hemostat was used with fluoroscopic guidance to locate the targets for each level blocked. Once located, the skin was marked with an approved surgical skin marker. Once all sites were marked, the skin (epidermis, dermis, and hypodermis), as well as deeper tissues (fat, connective tissue and muscle) were infiltrated with a small amount of a short-acting local anesthetic, loaded on a 10cc syringe with a 25G, 1.5-in  Needle. An appropriate amount of time was allowed for local anesthetics to take effect before proceeding to the next step. Local Anesthetic: Lidocaine 2.0% The unused portion of the local anesthetic was discarded in the proper designated containers. Technical explanation of process:  L2 Medial Branch Nerve Block (MBB): The target area for the L2 medial branch is at the junction of the postero-lateral aspect of the superior articular process and the superior, posterior, and medial edge of the transverse process of L3. Under fluoroscopic guidance, a Quincke needle was inserted until contact was made with os over the superior postero-lateral aspect of the pedicular shadow (target area). After negative aspiration for blood, 0.5 mL of the nerve block solution was injected without difficulty or complication. The needle was removed intact. L3 Medial Branch Nerve Block (MBB): The target area for the  L3 medial branch is at the junction of the postero-lateral aspect of the superior articular process and the superior, posterior, and medial edge of the transverse process of L4. Under fluoroscopic guidance, a Quincke needle was inserted until contact was made with os over the superior postero-lateral aspect of the pedicular shadow (target area). After negative aspiration for blood, 0.5 mL of the nerve block solution was injected without difficulty or complication. The needle was removed intact. L4 Medial Branch Nerve Block (MBB): The target area for the L4 medial branch is at the junction of the postero-lateral aspect of the superior articular process and the superior, posterior, and medial edge of the transverse process of L5. Under fluoroscopic guidance, a Quincke needle was inserted until contact was made with os over the superior postero-lateral aspect of the pedicular shadow (target area). After negative aspiration for blood, 0.5 mL of the nerve block solution was injected without difficulty or complication. The needle was removed intact. L5 Medial Branch Nerve Block (MBB): The target area for the L5 medial branch is at the junction of the postero-lateral aspect of the superior articular process and the superior, posterior, and medial edge of the sacral ala. Under fluoroscopic guidance, a Quincke needle was inserted until contact was made with os over the superior postero-lateral aspect of the pedicular shadow (target area). After negative aspiration for blood, 0.5 mL of the nerve block solution was injected without difficulty or complication. The needle was removed intact. S1 Medial Branch Nerve Block (MBB): The target area for the S1 medial branch is at the posterior and inferior 6 o'clock position of the L5-S1 facet joint. Under fluoroscopic guidance, the Quincke needle inserted for the L5 MBB was redirected until contact was made with os over the inferior and postero aspect of the sacrum, at the 6 o'  clock position under the L5-S1 facet joint (Target area). After negative aspiration for blood, 0.5 mL of the nerve block solution was injected without difficulty or complication. The needle was  removed intact.  Nerve block solution: 0.2% PF-Ropivacaine + Triamcinolone (40 mg/mL) diluted to a final concentration of 4 mg of Triamcinolone/mL of Ropivacaine The unused portion of the solution was discarded in the proper designated containers. Procedural Needles: 22-gauge, 3.5-inch, Quincke needles used for all levels.  Once the entire procedure was completed, the treated area was cleaned, making sure to leave some of the prepping solution back to take advantage of its long term bactericidal properties.      Illustration of the posterior view of the lumbar spine and the posterior neural structures. Laminae of L2 through S1 are labeled. DPRL5, dorsal primary ramus of L5; DPRS1, dorsal primary ramus of S1; DPR3, dorsal primary ramus of L3; FJ, facet (zygapophyseal) joint L3-L4; I, inferior articular process of L4; LB1, lateral branch of dorsal primary ramus of L1; IAB, inferior articular branches from L3 medial branch (supplies L4-L5 facet joint); IBP, intermediate branch plexus; MB3, medial branch of dorsal primary ramus of L3; NR3, third lumbar nerve root; S, superior articular process of L5; SAB, superior articular branches from L4 (supplies L4-5 facet joint also); TP3, transverse process of L3.  Vitals:   10/03/21 0915 10/03/21 0919 10/03/21 0926 10/03/21 0934  BP: (!) 147/71 (!) 149/76 (!) 130/39 110/68  Pulse: 66 72 65 65  Resp: 17 16 16 16   Temp:   (!) 96.9 F (36.1 C) (!) 97.2 F (36.2 C)  TempSrc:   Temporal Temporal  SpO2: 100% 100% 100% 100%  Weight:      Height:         Start Time: 0908 hrs. End Time: 0917 hrs.  Imaging Guidance (Spinal):          Type of Imaging Technique: Fluoroscopy Guidance (Spinal) Indication(s): Assistance in needle guidance and placement for procedures  requiring needle placement in or near specific anatomical locations not easily accessible without such assistance. Exposure Time: Please see nurses notes. Contrast: None used. Fluoroscopic Guidance: I was personally present during the use of fluoroscopy. "Tunnel Vision Technique" used to obtain the best possible view of the target area. Parallax error corrected before commencing the procedure. "Direction-depth-direction" technique used to introduce the needle under continuous pulsed fluoroscopy. Once target was reached, antero-posterior, oblique, and lateral fluoroscopic projection used confirm needle placement in all planes. Images permanently stored in EMR. Interpretation: No contrast injected. I personally interpreted the imaging intraoperatively. Adequate needle placement confirmed in multiple planes. Permanent images saved into the patient's record.  Antibiotic Prophylaxis:   Anti-infectives (From admission, onward)    None      Indication(s): None identified  Post-operative Assessment:  Post-procedure Vital Signs:  Pulse/HCG Rate: 65  Temp: (!) 97.2 F (36.2 C) Resp: 16 BP: 110/68 SpO2: 100 %  EBL: None  Complications: No immediate post-treatment complications observed by team, or reported by patient.  Note: The patient tolerated the entire procedure well. A repeat set of vitals were taken after the procedure and the patient was kept under observation following institutional policy, for this type of procedure. Post-procedural neurological assessment was performed, showing return to baseline, prior to discharge. The patient was provided with post-procedure discharge instructions, including a section on how to identify potential problems. Should any problems arise concerning this procedure, the patient was given instructions to immediately contact us, at any time, without hesitation. In any case, we plan to contact the patient by telephone for a follow-up status report regarding this  interventional procedure.  Comments:  No additional relevant information.  Plan of Care  Orders:  Orders Placed This Encounter  Procedures   LUMBAR FACET(MEDIAL BRANCH NERVE BLOCK) MBNB    Scheduling Instructions:     Procedure: Lumbar facet block (AKA.: Lumbosacral medial branch nerve block)     Side: Bilateral     Level: L3-4, L4-5, & L5-S1 Facets (L2, L3, L4, L5, & S1 Medial Branch Nerves)     Sedation: Patient's choice.     Timeframe: Today    Order Specific Question:   Where will this procedure be performed?    Answer:   [facility]   DG PAIN [facility] C-ARM 1-60 MIN NO REPORT    Intraoperative interpretation by procedural physician at [place].    Standing Status:   Standing    Number of Occurrences:   1    Order Specific Question:   Reason for exam:    Answer:   Assistance in needle guidance and placement for procedures requiring needle placement in or near specific anatomical locations not easily accessible without such assistance.   Informed Consent Details: Physician/Practitioner Attestation; Transcribe to consent form and obtain patient signature    Nursing Order: Transcribe to consent form and obtain patient signature. Note: Always confirm laterality of pain with Ms. Mattson, before procedure.    Order Specific Question:   Physician/Practitioner attestation of informed consent for procedure/surgical case    Answer:   I, the physician/practitioner, attest that I have discussed with the patient the benefits, risks, side effects, alternatives, likelihood of achieving goals and potential problems during recovery for the procedure that I have provided informed consent.    Order Specific Question:   Procedure    Answer:   Lumbar Facet Block  under fluoroscopic guidance    Order Specific Question:   Physician/Practitioner performing the procedure    Answer:   Tracee Mccreery A. Dossie Arbour MD    Order Specific Question:   Indication/Reason    Answer:   Low Back Pain,  with our without leg pain, due to Facet Joint Arthralgia (Joint Pain) Spondylosis (Arthritis of the Spine), without myelopathy or radiculopathy (Nerve Damage).   Provide equipment / supplies at bedside    "Block Tray" (Disposable  single use) Needle type: SpinalSpinal Amount/quantity: 4 Size: Medium (5-inch) Gauge: 22G    Standing Status:   Standing    Number of Occurrences:   1    Order Specific Question:   Specify    Answer:   Block Tray   Latex precautions    Activate Latex-Free Protocol.    Standing Status:   Standing    Number of Occurrences:   1    Chronic Opioid Analgesic:  Oxycodone IR 5 mg, 1 tab p.o. twice daily (10 mg/day of oxycodone) (15 MME/day) MME/day: 15 mg/day   Medications ordered for procedure: Meds ordered this encounter  Medications   lidocaine (XYLOCAINE) 2 % (with pres) injection 400 mg   pentafluoroprop-tetrafluoroeth (GEBAUERS) aerosol   lactated ringers infusion 1,000 mL   midazolam (VERSED) 5 MG/5ML injection 0.5-2 mg    Make sure Flumazenil is available in the pyxis when using this medication. If oversedation occurs, administer 0.2 mg IV over 15 sec. If after 45 sec no response, administer 0.2 mg again over 1 min; may repeat at 1 min intervals; not to exceed 4 doses (1 mg)   ropivacaine (PF) 2 mg/mL (0.2%) (NAROPIN) injection 18 mL   triamcinolone acetonide (KENALOG-40) injection 80 mg    Medications administered: We administered lidocaine, pentafluoroprop-tetrafluoroeth, lactated ringers, midazolam, ropivacaine (PF) 2 mg/mL (0.2%), and  triamcinolone acetonide.  See the medical record for exact dosing, route, and time of administration.  Follow-up plan:   Return in about 2 weeks (around 10/17/2021) for Proc-day (T,Th), (F2F), (PPE).       Interventional Therapies  Risk  Complexity Considerations:   Estimated body mass index is 37.12 kg/m as calculated from the following:   Height as of this encounter: 5\' 6"  (1.676 m).   Weight as of this  encounter: 230 lb (104.3 kg). Anxiety and depression disorder  Allergy: Latex     Planned  Pending:   Pending further evaluation   Under consideration:   Diagnostic bilateral lumbar facet MBB  Diagnostic midline caudal ESI + epidurogram  Diagnostic bilateral IA shoulder joint injection  Diagnostic bilateral suprascapular nerve block  Diagnostic bilateral femoral and obturator nerve block  Diagnostic left genicular nerves block     Completed at other practices:   Failed Epidural Neurostimulator Implant (SCS)  Procedures done by Haskel Khan, MD: Therapeutic bilateral L3-4 facet joint injection x2 (08/02/2018, 11/08/2018)  Therapeutic bilateral L4 TFESI x4 (07/18/2013, 04/03/2014, 05/29/2014, 12/04/2014)  Therapeutic bilateral L5 TFESI x4 (07/18/2013, 04/03/2014, 05/29/2014, 12/04/2014)  Therapeutic right L4-5 and L5-S1 posterior hemilaminectomy with facetectomy and foraminotomy (02/01/2015)  Procedures done by Shirlyn Goltz, MD: Therapeutic right L3-5 medial branch RFA x1 (12/06/2020)  Therapeutic right L4, L5, S1 MBB x1 (11/12/2020) Therapeutic right L3, L4, L5 MBB x1 (10/10/2020)   Completed:   None at this time   Therapeutic  Palliative (PRN) options:   None established     Recent Visits Date Type Provider Dept  09/11/21 Office Visit Milinda Pointer, MD [facility]  Showing recent visits within past 90 days and meeting all other requirements Today's Visits Date Type Provider Dept  10/03/21 Procedure visit Milinda Pointer, MD [facility]  Showing today's visits and meeting all other requirements Future Appointments Date Type Provider Dept  10/17/21 Appointment Milinda Pointer, MD [facility]  10/21/21 Appointment Milinda Pointer, MD [facility]  Showing future appointments within next 90 days and meeting all other requirements Disposition: Discharge home  Discharge (Date  Time): 10/03/2021; 0936 hrs.   Primary  Care Physician: Kirk Ruths, MD Location: [facility] Note by: Gaspar Cola, MD Date: 10/03/2021; Time: 11:58 AM  Disclaimer:  Medicine is not an exact science. The only guarantee in medicine is that nothing is guaranteed. It is important to note that the decision to proceed with this intervention was based on the information collected from the patient. The Data and conclusions were drawn from the patient's questionnaire, the interview, and the physical examination. Because the information was provided in large part by the patient, it cannot be guaranteed that it has not been purposely or unconsciously manipulated. Every effort has been made to obtain as much relevant data as possible for this evaluation. It is important to note that the conclusions that lead to this procedure are derived in large part from the available data. Always take into account that the treatment will also be dependent on availability of resources and existing treatment guidelines, considered by other Pain Management Practitioners as being common knowledge and practice, at the time of the intervention. For Medico-Legal purposes, it is also important to point out that variation in procedural techniques and pharmacological choices are the acceptable norm. The indications, contraindications, technique, and results of the above procedure should only be interpreted and judged by a Board-Certified Interventional Pain Specialist with extensive familiarity and  expertise in the same exact procedure and technique.

## 2021-10-03 NOTE — Patient Instructions (Signed)

## 2021-10-04 ENCOUNTER — Telehealth: Payer: Self-pay | Admitting: *Deleted

## 2021-10-04 NOTE — Telephone Encounter (Signed)
Post procedure call;  patient reports that she feels wonderful and is amazed at her results.  Encouraged her not to over do it this weekend.  Patient verbalizes u/o information.

## 2021-10-17 ENCOUNTER — Telehealth: Payer: Medicare HMO | Admitting: Pain Medicine

## 2021-10-20 NOTE — Progress Notes (Signed)
PROVIDER NOTE: Information contained herein reflects review and annotations entered in association with encounter. Interpretation of such information and data should be left to medically-trained personnel. Information provided to patient can be located elsewhere in the medical record under "Patient Instructions". Document created using STT-dictation technology, any transcriptional errors that may result from process are unintentional.    Patient: Grace Zimmerman  Service Category: E/M  Provider: Gaspar Cola, MD  DOB: 1946-03-30  DOS: 10/21/2021  Specialty: Interventional Pain Management  MRN: [id number]  Setting: Ambulatory outpatient  PCP: Grace Ruths, MD  Type: Established Patient    Referring Provider: Kirk Ruths, MD  Location: Office  Delivery: Face-to-face     HPI  [name], a 75 y.o. year old female, is here today because of her No primary diagnosis found.. [name] primary complain today is Back Pain (lower) Last encounter: My last encounter with her was on 10/03/2021. Pertinent problems: Ms. 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; Osteoarthritis; Osteoarthritis of carpometacarpal joint of right thumb; Spinal stenosis of lumbosacral region; Chronic pain syndrome; Abnormal MRI, lumbar spine (05/31/2020); Lumbar postlaminectomy syndrome; Lumbar Grade 1 Anterolisthesis of L3/L4 and L4/L5; Lumbosacral Grade 1 Anterolisthesis of L5/S1; Lumbar Grade 1 Retrolisthesis of L2/L3; Lumbar facet hypertrophy (Multilevel) (Bilateral); Lumbar central spinal stenosis, 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); Lumbar lateral recess stenosis (Bilateral: L2-3) (Right:  L3-4); Lumbar foraminal stenosis (Right: L2-3 L4-5) (Left: L5-S1) (Bilateral: L3-4); Annular tear of lumbar disc (Left: L4-5); Lumbosacral facet syndrome; DDD (degenerative disc disease), lumbosacral; Abnormal x-ray of lumbar spine (05/31/2020); Failed spinal cord stimulator; Chronic shoulder pain (2ry area of Pain) (Bilateral) (L>R); Chronic upper back pain (Bilateral); Failed back surgical syndrome; Chronic lower extremity pain (3ry area of Pain) (Bilateral) (R>L); Chronic hip pain (Bilateral) (R>L); Chronic knee pain (5th area of Pain) (Left); Chronic hip pain after THR (total hip replacement) (4th area of Pain) (Bilateral); Chronic knee pain (Bilateral) 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 Pain Assessment: Severity of Chronic pain is reported as a 0-No pain/10. Location: Back Lower/ . Onset: More than a month ago. Quality: Other (Comment) (intense). Timing: Rarely. Modifying factor(s):  Marland Kitchen Vitals:  height is 5' 6" (1.676 m) and weight is 235 lb (106.6 kg). Her temporal temperature is 97.1 F (36.2 C) (abnormal). Her blood pressure is 131/64 and her pulse is 70. Her respiration is 18 and oxygen saturation is 100%.   Reason for encounter: both, medication management and post-procedure assessment.  The patient reports having attained an ongoing 100% relief of her pain to the point where she is no longer taking the pain medication.  Because we are dealing with facet arthropathy and she is overweight, it is very likely that this will be coming back.  Hopefully the benefit will last for a long time, but the patient has been warned that this was not a permanent solution to her problem.  I have encouraged her to start working on bringing her BMI down with a goal of getting as close as possible to a BMI of 30 kg/m.  The patient has been instructed to give [place] a call when she needs [place].  RTCB: 01/19/2022 Nonopioids transferred  06/12/2021: Lyrica  Post-Procedure Evaluation  Procedure (10/03/2021):  Procedure:           Anesthesia, Analgesia, Anxiolysis:  Type: Lumbar Facet, Medial Branch Block(s) #1  Primary Purpose: Diagnostic Region: Posterolateral Lumbosacral Spine Level: L2, L3, L4, L5, & S1 Medial Branch Level(s). Injecting these levels blocks the L3-4, L4-5, and L5-S1 lumbar facet joints. Laterality: Bilateral   Type: Local Anesthesia Local Anesthetic: Lidocaine 1-2% Sedation: Minimal Anxiolysis  Indication(s): Anxiety & Analgesia Route: Infiltration ([place]/IM) IV Access: Available     Position: Prone    Indications: 1. Lumbosacral facet syndrome   2. Spondylosis without myelopathy or radiculopathy, lumbosacral region   3. Lumbar facet hypertrophy (Multilevel) (Bilateral)   4. DDD (degenerative disc disease), lumbosacral   5. Lumbar Grade 1 Retrolisthesis of L2/L3   6. 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 Chronic low back pain (1ry area of Pain) (Bilateral) (R>L) w/o sciatica   8. Failed back surgical syndrome   9. Encounter for therapeutic procedure   10. Latex precautions, history of latex allergy     Pain Score: Pre-procedure: 5 /10 Post-procedure: 0-No pain/10     Anxiolysis: Please see nurses note.  Effectiveness during initial hour after procedure (Ultra-Short Term Relief): 100 %.  Local anesthetic used: Long-acting (4-6 hours) Effectiveness: Defined as any analgesic benefit obtained secondary to the administration of local anesthetics. This carries significant diagnostic value as to the etiological location, or anatomical origin, of the pain. Duration of benefit is expected to coincide with the duration of the local anesthetic used.  Effectiveness during initial 4-6 hours after procedure (Short-Term Relief): 100 %.  Long-term benefit: Defined as any relief past the pharmacologic duration of the local anesthetics.  Effectiveness past the initial 6 hours after procedure  (Long-Term Relief): 100 %.  Benefits, current: Defined as benefit present at the time of this evaluation.   Analgesia: The patient indicates currently having an ongoing 100% relief of the pain to the point where she is no longer taking her pain medication. Function: Ms. Bonneau reports improvement in function ROM: Ms. Plotner reports improvement in ROM   Pharmacotherapy Assessment  Analgesic: Oxycodone IR 5 mg, 1 tab p.o. twice daily (10 mg/day of oxycodone) (15 MME/day) MME/day: 15 mg/day   Monitoring: [place] PMP: PDMP reviewed during this encounter.       Pharmacotherapy: No side-effects or adverse reactions reported. Compliance: No problems identified. Effectiveness: Clinically acceptable.  Landis Martins, RN  10/21/2021 10:29 AM  Sign when Signing Visit Nursing Pain Medication Assessment:  Safety precautions to be maintained throughout the outpatient stay will include: orient to surroundings, keep bed in low position, maintain call bell within reach at all times, provide assistance with transfer out of bed and ambulation.  Medication Inspection Compliance: Pill count conducted under aseptic conditions, in front of the patient. Neither the pills nor the bottle was removed from the patient's sight at any time. Once count was completed pills were immediately returned to the patient in their original bottle.  Medication: Oxycodone IR Pill/Patch Count:  25 of 60 pills remain Pill/Patch Appearance: Markings consistent with prescribed medication Bottle Appearance: Standard pharmacy container. Clearly labeled. Filled Date: 10 / 01 / 2022 Last Medication intake:   Has not needed any since facet block on 10-03-21    UDS:  Summary  Date Value Ref Range Status  06/12/2021 Note  Final    Comment:    ==================================================================== Compliance Drug Analysis, Ur ==================================================================== Test  Result       Flag       Units  Drug Present and Declared for Prescription Verification   Oxymorphone                    853          EXPECTED   ng/mg creat   Noroxycodone                   926          EXPECTED   ng/mg creat   Noroxymorphone                 219          EXPECTED   ng/mg creat    Oxymorphone, noroxycodone and noroxymorphone are expected    metabolites of oxycodone. Noroxymorphone is an expected metabolite    of oxymorphone. Sources of oxycodone and/or oxymorphone include    scheduled prescription medications.    Pregabalin                     PRESENT      EXPECTED   Trazodone                      PRESENT      EXPECTED   1,3 chlorophenyl piperazine    PRESENT      EXPECTED    1,3-chlorophenyl piperazine is an expected metabolite of trazodone.  Drug Absent but Declared for Prescription Verification   Oxycodone                      Not Detected UNEXPECTED ng/mg creat    Oxycodone is almost always present in patients taking this drug    consistently.  Absence of oxycodone could be due to lapse of time    since the last dose or unusual pharmacokinetics (rapid metabolism).    Duloxetine                     Not Detected UNEXPECTED ==================================================================== Test                      Result    Flag   Units      Ref Range   Creatinine              47               mg/dL      >=20 ==================================================================== Declared Medications:  The flagging and interpretation on this report are based on the  following declared medications.  Unexpected results may arise from  inaccuracies in the declared medications.   **Note: The testing scope of this panel includes these medications:   Duloxetine  Oxycodone  Pregabalin  Trazodone   **Note: The testing scope of this panel does not include the  following reported medications:   Buspirone  Naloxone  Olmesartan (Benicar)  Pantoprazole   Torsemide  Vitamin D2 ==================================================================== For clinical consultation, please call [phone]. ====================================================================      ROS  Constitutional: Denies any fever or chills Gastrointestinal: No reported hemesis, hematochezia, vomiting, or acute GI distress Musculoskeletal: Denies any acute onset joint swelling, redness, loss of ROM, or weakness Neurological: No reported episodes of acute onset apraxia, aphasia, dysarthria, agnosia, amnesia, paralysis, loss of coordination, or loss of consciousness  Medication Review  DULoxetine, amLODipine, ergocalciferol, linaclotide, naloxone, olmesartan, oxyCODONE, pantoprazole, pregabalin, torsemide, and  traZODone  History Review  Allergy: [name] is allergic to latex. Drug: [name]  reports no history of drug use. Alcohol:  reports that she does not currently use alcohol. Tobacco:  reports that she has never smoked. She has never used smokeless tobacco. Social: [name]  reports that she has never smoked. She has never used smokeless tobacco. She reports that she does not currently use alcohol. She reports that she does not use drugs. Medical:  has a past medical history of Anxiety, Depression, and Hypertension. Surgical: [name]  has a past surgical history that includes Joint replacement; Hand surgery (Bilateral); and Abdominal hysterectomy. Family: family history is not on file.  Laboratory Chemistry Profile   Renal Lab Results  Component Value Date   BUN 29 (H) 01/09/2021   CREATININE 1.60 (H) 01/09/2021   GFRNONAA 33 (L) 01/09/2021    Hepatic Lab Results  Component Value Date   AST 19 01/09/2021   ALT 14 01/09/2021   ALBUMIN 4.0 01/09/2021   ALKPHOS 84 01/09/2021    Electrolytes Lab Results  Component Value Date   NA 136 01/09/2021   K 3.9 01/09/2021   CL 101 01/09/2021   CALCIUM 8.9 01/09/2021   MG 2.6 (H)  03/13/2021    Bone Lab Results  Component Value Date   VD25OH 14.23 (L) 03/13/2021    Inflammation (CRP: Acute Phase) (ESR: Chronic Phase) Lab Results  Component Value Date   CRP 0.5 03/13/2021   ESRSEDRATE 28 03/13/2021         Note: Above Lab results reviewed.  Recent Imaging Review  DG PAIN CLINIC C-ARM 1-60 MIN NO REPORT Fluoro was used, but no Radiologist interpretation will be provided.  Please refer to "NOTES" tab for provider progress note. Note: Reviewed        Physical Exam  General appearance: Well nourished, well developed, and well hydrated. In no apparent acute distress Mental status: Alert, oriented x 3 (person, place, & time)       Respiratory: No evidence of acute respiratory distress Eyes: PERLA Vitals: BP 131/64   Pulse 70   Temp (!) 97.1 F (36.2 C) (Temporal)   Resp 18   Ht 5' 6" (1.676 m)   Wt 235 lb (106.6 kg)   SpO2 100%   BMI 37.93 kg/m  BMI: Estimated body mass index is 37.93 kg/m as calculated from the following:   Height as of this encounter: 5' 6" (1.676 m).   Weight as of this encounter: 235 lb (106.6 kg). Ideal: Ideal body weight: 59.3 kg (130 lb 11.7 oz) Adjusted ideal body weight: 78.2 kg (172 lb 7 oz)  Assessment   Status Diagnosis  Controlled Controlled Controlled 1. Chronic low back pain (1ry area of Pain) (Bilateral) (R>L) w/o sciatica   2. Chronic shoulder pain (2ry area of Pain) (Bilateral) (L>R)   3. Chronic lower extremity pain (3ry area of Pain) (Bilateral) (R>L)   4. Chronic hip pain after THR (total hip replacement) (4th area of Pain) (Bilateral)   5. Chronic knee pain (5th area of Pain) (Left)   6. Chronic pain syndrome   7. Pharmacologic therapy   8. Chronic use of opiate for therapeutic purpose   9. Encounter for chronic pain management   10. Chronic neuropathic pain      Updated Problems: No problems updated.  Plan of Care  Problem-specific:  No problem-specific Assessment & Plan notes found for this  encounter.  [name] has a current medication  list which includes the following long-term medication(s): amlodipine, duloxetine, duloxetine, duloxetine, linaclotide, olmesartan, oxycodone, pantoprazole, pregabalin, torsemide, trazodone, trazodone, ergocalciferol, and olmesartan.  Pharmacotherapy (Medications Ordered): Meds ordered this encounter  Medications   oxyCODONE (OXY IR/ROXICODONE) 5 MG immediate release tablet    Sig: Take 1 tablet (5 mg total) by mouth 2 (two) times daily as needed for severe pain. Must last 30 days.    Dispense:  30 tablet    Refill:  0    DO NOT: delete (not duplicate); no partial-fill (will deny script to complete), no refill request (F/U required). DISPENSE: 1 day early if closed on fill date. WARN: No CNS-depressants within 8 hrs of med.    Orders:  No orders of the defined types were placed in this encounter.  Follow-up plan:   Return if symptoms worsen or fail to improve.     Interventional Therapies  Risk  Complexity Considerations:   Estimated body mass index is 37.93 kg/m as calculated from the following:   Height as of this encounter: 5' 6" (1.676 m).   Weight as of this encounter: 235 lb (106.6 kg). Allergy: Latex  Anxiety and depression disorder    Planned  Pending:   Pending further evaluation   Under consideration:   Diagnostic bilateral lumbar facet MBB #2  Diagnostic bilateral lumbar facet RFA  Diagnostic bilateral IA shoulder joint injection  Diagnostic bilateral suprascapular NB  Diagnostic left genicular NB     Completed at other practices:   Failed Epidural Neurostimulator Implant (SCS)  Procedures done by Haskel Khan, MD: Therapeutic bilateral L3-4 facet joint injection x2 (08/02/2018, 11/08/2018)  Therapeutic bilateral L4 TFESI x4 (07/18/2013, 04/03/2014, 05/29/2014, 12/04/2014)  Therapeutic bilateral L5 TFESI x4 (07/18/2013, 04/03/2014, 05/29/2014, 12/04/2014)  Therapeutic right L4-5 and L5-S1 posterior  hemilaminectomy with facetectomy and foraminotomy (02/01/2015)  Procedures done by Shirlyn Goltz, MD: Therapeutic right L3-5 medial branch RFA x1 (12/06/2020)  Therapeutic right L4, L5, S1 MBB x1 (11/12/2020) Therapeutic right L3, L4, L5 MBB x1 (10/10/2020)   Completed:   Diagnostic bilateral lumbar facet MBB x1 (10/03/2021) (100/100/100/100)    Therapeutic  Palliative (PRN) options:   Therapeutic/palliative bilateral lumbar facet MBB     Recent Visits Date Type Provider Dept  10/03/21 Procedure visit Milinda Pointer, MD [facility]  09/11/21 Office Visit Milinda Pointer, MD [facility]  Showing recent visits within past 90 days and meeting all other requirements Today's Visits Date Type Provider Dept  10/21/21 Office Visit Milinda Pointer, MD [facility]  Showing today's visits and meeting all other requirements Future Appointments Date Type Provider Dept  11/07/21 Appointment Milinda Pointer, MD [facility]  Showing future appointments within next 90 days and meeting all other requirements I discussed the assessment and treatment plan with the patient. The patient was provided an opportunity to ask questions and all were answered. The patient agreed with the plan and demonstrated an understanding of the instructions.  Patient advised to call back or seek an in-person evaluation if the symptoms or condition worsens.  Duration of encounter: 30 minutes.  Note by: Gaspar Cola, MD Date: 10/21/2021; Time: 11:02 AM

## 2021-10-21 ENCOUNTER — Other Ambulatory Visit: Payer: Self-pay

## 2021-10-21 ENCOUNTER — Encounter: Payer: Self-pay | Admitting: Pain Medicine

## 2021-10-21 ENCOUNTER — Ambulatory Visit: Payer: Medicare HMO | Attending: Pain Medicine | Admitting: Pain Medicine

## 2021-10-21 DIAGNOSIS — M25511 Pain in right shoulder: Secondary | ICD-10-CM | POA: Diagnosis not present

## 2021-10-21 DIAGNOSIS — G8929 Other chronic pain: Secondary | ICD-10-CM

## 2021-10-21 DIAGNOSIS — M25551 Pain in right hip: Secondary | ICD-10-CM | POA: Diagnosis not present

## 2021-10-21 DIAGNOSIS — M25512 Pain in left shoulder: Secondary | ICD-10-CM

## 2021-10-21 DIAGNOSIS — M25562 Pain in left knee: Secondary | ICD-10-CM | POA: Diagnosis not present

## 2021-10-21 DIAGNOSIS — G8928 Other chronic postprocedural pain: Secondary | ICD-10-CM

## 2021-10-21 DIAGNOSIS — Z79899 Other long term (current) drug therapy: Secondary | ICD-10-CM

## 2021-10-21 DIAGNOSIS — M545 Low back pain, unspecified: Secondary | ICD-10-CM

## 2021-10-21 DIAGNOSIS — G894 Chronic pain syndrome: Secondary | ICD-10-CM

## 2021-10-21 DIAGNOSIS — Z96643 Presence of artificial hip joint, bilateral: Secondary | ICD-10-CM

## 2021-10-21 DIAGNOSIS — M792 Neuralgia and neuritis, unspecified: Secondary | ICD-10-CM

## 2021-10-21 DIAGNOSIS — M25552 Pain in left hip: Secondary | ICD-10-CM

## 2021-10-21 DIAGNOSIS — Z79891 Long term (current) use of opiate analgesic: Secondary | ICD-10-CM | POA: Diagnosis not present

## 2021-10-21 DIAGNOSIS — M79604 Pain in right leg: Secondary | ICD-10-CM | POA: Diagnosis not present

## 2021-10-21 DIAGNOSIS — M79605 Pain in left leg: Secondary | ICD-10-CM

## 2021-10-21 MED ORDER — OXYCODONE HCL 5 MG PO TABS: 5.0000 mg | ORAL_TABLET | Freq: Two times a day (BID) | ORAL | 0 refills | Status: DC | PRN

## 2021-10-21 NOTE — Progress Notes (Signed)
Nursing Pain Medication Assessment:  Safety precautions to be maintained throughout the outpatient stay will include: orient to surroundings, keep bed in low position, maintain call bell within reach at all times, provide assistance with transfer out of bed and ambulation.  Medication Inspection Compliance: Pill count conducted under aseptic conditions, in front of the patient. Neither the pills nor the bottle was removed from the patient's sight at any time. Once count was completed pills were immediately returned to the patient in their original bottle.  Medication: Oxycodone IR Pill/Patch Count:  25 of 60 pills remain Pill/Patch Appearance: Markings consistent with prescribed medication Bottle Appearance: Standard pharmacy container. Clearly labeled. Filled Date: 10 / 01 / 2022 Last Medication intake:   Has not needed any since facet block on 10-03-21

## 2021-10-21 NOTE — Patient Instructions (Signed)
____________________________________________________________________________________________  Medication Rules  Purpose: To inform patients, and their family members, of our rules and regulations.  Applies to: All patients receiving prescriptions (written or electronic).  Pharmacy of record: Pharmacy where electronic prescriptions will be sent. If written prescriptions are taken to a different pharmacy, please inform the nursing staff. The pharmacy listed in the electronic medical record should be the one where you would like electronic prescriptions to be sent.  Electronic prescriptions: In compliance with the [place] Strengthen Opioid Misuse Prevention (STOP) Act of 2017 (Session Law 2017-74/H243), effective December 22, 2018, all controlled substances must be electronically prescribed. Calling prescriptions to the pharmacy will cease to exist.  Prescription refills: Only during scheduled appointments. Applies to all prescriptions.  NOTE: The following applies primarily to controlled substances (Opioid* Pain Medications).   Type of encounter (visit): For patients receiving controlled substances, face-to-face visits are required. (Not an option or up to the patient.)  Patient's responsibilities: Pain Pills: Bring all pain pills to every appointment (except for procedure appointments). Pill Bottles: Bring pills in original pharmacy bottle. Always bring the newest bottle. Bring bottle, even if empty. Medication refills: You are responsible for knowing and keeping track of what medications you take and those you need refilled. The day before your appointment: write a list of all prescriptions that need to be refilled. The day of the appointment: give the list to the admitting nurse. Prescriptions will be written only during appointments. No prescriptions will be written on procedure days. If you forget a medication: it will not be "Called in", "Faxed", or "electronically sent". You will  need to get another appointment to get these prescribed. No early refills. Do not call asking to have your prescription filled early. Prescription Accuracy: You are responsible for carefully inspecting your prescriptions before leaving our office. Have the discharge nurse carefully go over each prescription with you, before taking them home. Make sure that your name is accurately spelled, that your address is correct. Check the name and dose of your medication to make sure it is accurate. Check the number of pills, and the written instructions to make sure they are clear and accurate. Make sure that you are given enough medication to last until your next medication refill appointment. Taking Medication: Take medication as prescribed. When it comes to controlled substances, taking less pills or less frequently than prescribed is permitted and encouraged. Never take more pills than instructed. Never take medication more frequently than prescribed.  Inform other Doctors: Always inform, all of your healthcare providers, of all the medications you take. Pain Medication from other Providers: You are not allowed to accept any additional pain medication from any other Doctor or Healthcare provider. There are two exceptions to this rule. (see below) In the event that you require additional pain medication, you are responsible for notifying us, as stated below. Cough Medicine: Often these contain an opioid, such as codeine or hydrocodone. Never accept or take cough medicine containing these opioids if you are already taking an opioid* medication. The combination may cause respiratory failure and death. Medication Agreement: You are responsible for carefully reading and following our Medication Agreement. This must be signed before receiving any prescriptions from our practice. Safely store a copy of your signed Agreement. Violations to the Agreement will result in no further prescriptions. (Additional copies of our  Medication Agreement are available upon request.) Laws, Rules, & Regulations: All patients are expected to follow all Federal and State Laws, Statutes, Rules, & Regulations. Ignorance of   the Laws does not constitute a valid excuse.  Illegal drugs and Controlled Substances: The use of illegal substances (including, but not limited to marijuana and its derivatives) and/or the illegal use of any controlled substances is strictly prohibited. Violation of this rule may result in the immediate and permanent discontinuation of any and all prescriptions being written by our practice. The use of any illegal substances is prohibited. Adopted CDC guidelines & recommendations: Target dosing levels will be at or below 60 MME/day. Use of benzodiazepines** is not recommended.  Exceptions: There are only two exceptions to the rule of not receiving pain medications from other Healthcare Providers. Exception #1 (Emergencies): In the event of an emergency (i.e.: accident requiring emergency care), you are allowed to receive additional pain medication. However, you are responsible for: As soon as you are able, call our office [phone], at any time of the day or night, and leave a message stating your name, the date and nature of the emergency, and the name and dose of the medication prescribed. In the event that your call is answered by a member of our staff, make sure to document and save the date, time, and the name of the person that took your information.  Exception #2 (Planned Surgery): In the event that you are scheduled by another doctor or dentist to have any type of surgery or procedure, you are allowed (for a period no longer than 30 days), to receive additional pain medication, for the acute post-op pain. However, in this case, you are responsible for picking up a copy of our "Post-op Pain Management for Surgeons" handout, and giving it to your surgeon or dentist. This document is available at our office, and  does not require an appointment to obtain it. Simply go to our office during business hours (Monday-Thursday from 8:00 AM to 4:00 PM) (Friday 8:00 AM to 12:00 Noon) or if you have a scheduled appointment with us, prior to your surgery, and ask for it by name. In addition, you are responsible for: calling our office [phone], at any time of the day or night, and leaving a message stating your name, name of your surgeon, type of surgery, and date of procedure or surgery. Failure to comply with your responsibilities may result in termination of therapy involving the controlled substances. Medication Agreement Violation. Following the above rules, including your responsibilities will help you in avoiding a Medication Agreement Violation ("Breaking your Pain Medication Contract").  *Opioid medications include: morphine, codeine, oxycodone, oxymorphone, hydrocodone, hydromorphone, meperidine, tramadol, tapentadol, buprenorphine, fentanyl, methadone. **Benzodiazepine medications include: diazepam (Valium), alprazolam (Xanax), clonazepam (Klonopine), lorazepam (Ativan), clorazepate (Tranxene), chlordiazepoxide (Librium), estazolam (Prosom), oxazepam (Serax), temazepam (Restoril), triazolam (Halcion) (Last updated: 09/18/2021) ____________________________________________________________________________________________  ____________________________________________________________________________________________  Medication Recommendations and Reminders  Applies to: All patients receiving prescriptions (written and/or electronic).  Medication Rules & Regulations: These rules and regulations exist for your safety and that of others. They are not flexible and neither are we. Dismissing or ignoring them will be considered "non-compliance" with medication therapy, resulting in complete and irreversible termination of such therapy. (See document titled "Medication Rules" for more details.) In all conscience,  because of safety reasons, we cannot continue providing a therapy where the patient does not follow instructions.  Pharmacy of record:  Definition: This is the pharmacy where your electronic prescriptions will be sent.  We do not endorse any particular pharmacy, however, we have experienced problems with [facility] not securing enough medication supply for the community. We do not restrict you   in your choice of pharmacy. However, once we write for your prescriptions, we will NOT be re-sending more prescriptions to fix restricted supply problems created by your pharmacy, or your insurance.  The pharmacy listed in the electronic medical record should be the one where you want electronic prescriptions to be sent. If you choose to change pharmacy, simply notify our nursing staff.  Recommendations: Keep all of your pain medications in a safe place, under lock and key, even if you live alone. We will NOT replace lost, stolen, or damaged medication. After you fill your prescription, take 1 week's worth of pills and put them away in a safe place. You should keep a separate, properly labeled bottle for this purpose. The remainder should be kept in the original bottle. Use this as your primary supply, until it runs out. Once it's gone, then you know that you have 1 week's worth of medicine, and it is time to come in for a prescription refill. If you do this correctly, it is unlikely that you will ever run out of medicine. To make sure that the above recommendation works, it is very important that you make sure your medication refill appointments are scheduled at least 1 week before you run out of medicine. To do this in an effective manner, make sure that you do not leave the office without scheduling your next medication management appointment. Always ask the nursing staff to show you in your prescription , when your medication will be running out. Then arrange for the receptionist to get you a return appointment,  at least 7 days before you run out of medicine. Do not wait until you have 1 or 2 pills left, to come in. This is very poor planning and does not take into consideration that we may need to cancel appointments due to bad weather, sickness, or emergencies affecting our staff. DO NOT ACCEPT A "Partial Fill": If for any reason your pharmacy does not have enough pills/tablets to completely fill or refill your prescription, do not allow for a "partial fill". The law allows the pharmacy to complete that prescription within 72 hours, without requiring a new prescription. If they do not fill the rest of your prescription within those 72 hours, you will need a separate prescription to fill the remaining amount, which we will NOT provide. If the reason for the partial fill is your insurance, you will need to talk to the pharmacist about payment alternatives for the remaining tablets, but again, DO NOT ACCEPT A PARTIAL FILL, unless you can trust your pharmacist to obtain the remainder of the pills within 72 hours.  Prescription refills and/or changes in medication(s):  Prescription refills, and/or changes in dose or medication, will be conducted only during scheduled medication management appointments. (Applies to both, written and electronic prescriptions.) No refills on procedure days. No medication will be changed or started on procedure days. No changes, adjustments, and/or refills will be conducted on a procedure day. Doing so will interfere with the diagnostic portion of the procedure. No phone refills. No medications will be "called into the pharmacy". No Fax refills. No weekend refills. No Holliday refills. No after hours refills.  Remember:  Business hours are:  Monday to Thursday 8:00 AM to 4:00 PM Provider's Schedule: Frankie Zito, MD - Appointments are:  Medication management: Monday and Wednesday 8:00 AM to 4:00 PM Procedure day: Tuesday and Thursday 7:30 AM to 4:00 PM Bilal Lateef, MD -  Appointments are:  Medication management: Tuesday and Thursday 8:00   AM to 4:00 PM Procedure day: Monday and Wednesday 7:30 AM to 4:00 PM (Last update: 07/11/2020) ____________________________________________________________________________________________

## 2021-10-22 ENCOUNTER — Telehealth: Payer: Self-pay

## 2021-10-22 NOTE — Telephone Encounter (Signed)
Pt was given a Rx for OXY for 30 pills and it says for her to take it 2x a day for 30 days however was only given enough for 15 days. Can we please call once this is corrected.Marland Kitchen

## 2021-10-22 NOTE — Telephone Encounter (Signed)
Called patient to let her know that the medication is prn only and she is not supposed to be taking around the clock.  Patient verbalizes u/o information.

## 2021-10-31 DIAGNOSIS — F325 Major depressive disorder, single episode, in full remission: Secondary | ICD-10-CM | POA: Diagnosis not present

## 2021-10-31 DIAGNOSIS — N183 Chronic kidney disease, stage 3 unspecified: Secondary | ICD-10-CM | POA: Diagnosis not present

## 2021-10-31 DIAGNOSIS — R399 Unspecified symptoms and signs involving the genitourinary system: Secondary | ICD-10-CM | POA: Diagnosis not present

## 2021-10-31 DIAGNOSIS — R8290 Unspecified abnormal findings in urine: Secondary | ICD-10-CM | POA: Diagnosis not present

## 2021-10-31 DIAGNOSIS — I129 Hypertensive chronic kidney disease with stage 1 through stage 4 chronic kidney disease, or unspecified chronic kidney disease: Secondary | ICD-10-CM | POA: Diagnosis not present

## 2021-10-31 DIAGNOSIS — E785 Hyperlipidemia, unspecified: Secondary | ICD-10-CM | POA: Diagnosis not present

## 2021-11-06 ENCOUNTER — Telehealth: Payer: Self-pay | Admitting: *Deleted

## 2021-11-06 NOTE — Telephone Encounter (Signed)
Attempted to call for pre appointment review of allergies/meds. Message left. 

## 2021-11-06 NOTE — Progress Notes (Signed)
Patient: Grace Zimmerman  Service Category: E/M  Provider: Gaspar Cola, MD  DOB: 02/21/1946  DOS: 11/07/2021  Location: Office  MRN: [id number]  Setting: Ambulatory outpatient  Referring Provider: Kirk Ruths, MD  Type: Established Patient  Specialty: Interventional Pain Management  PCP: Kirk Ruths, MD  Location: Remote location  Delivery: TeleHealth     Virtual Encounter - Pain Management PROVIDER NOTE: Information contained herein reflects review and annotations entered in association with encounter. Interpretation of such information and data should be left to medically-trained personnel. Information provided to patient can be located elsewhere in the medical record under "Patient Instructions". Document created using STT-dictation technology, any transcriptional errors that may result from process are unintentional.    Contact & Pharmacy Preferred: [phone] Home: [phone] (home) Mobile: [phone] (mobile) E-mail: [email or web address]  [id number]: [phone]: [phone]  Pre-screening  Grace Zimmerman offered "in-person" vs "virtual" encounter. She indicated preferring virtual for this encounter.   Reason COVID-19*  Social distancing based on CDC and AMA recommendations.   I contacted [name] on 11/07/2021 via telephone.      I clearly identified myself as FGaspar Cola MD. I verified that I was speaking with the correct person using two identifiers (Name: PSHEYLIN Zimmerman and date of birth: [birth year].  Consent I sought verbal advanced consent from Grace Zimmerman virtual visit interactions. I informed Grace Zimmerman of possible security and privacy concerns, risks, and limitations associated with providing "not-in-person" medical evaluation and management services. I also informed Grace Zimmerman of the availability of  "in-person" appointments. Finally, I informed her that there would be a charge for the virtual visit and that she could be  personally, fully or partially, financially responsible for it. Grace Zimmerman expressed understanding and agreed to proceed.   Historic Elements   Ms. Grace MILESis a 75y.o. year old, female patient evaluated today after our last contact on 10/21/2021. Grace Zimmerman  has a past medical history of Anxiety, Depression, and Hypertension. She also  has a past surgical history that includes Joint replacement; Hand surgery (Bilateral); and Abdominal hysterectomy. Grace Zimmerman has a current medication list which includes the following prescription(s): amlodipine, duloxetine, duloxetine, duloxetine, naloxone, olmesartan, oxycodone, pantoprazole, torsemide, trazodone, trazodone, ergocalciferol, and olmesartan. She  reports that she has never smoked. She has never used smokeless tobacco. She reports that she does not currently use alcohol. She reports that she does not use drugs. Grace Zimmerman is allergic to latex.   HPI  Today, she is being contacted for follow-up evaluation.  As of today, the patient indicates that she is still doing really good after her diagnostic bilateral lumbar facet block # 1.  Her procedure was done on 10/03/2021 and she continues to enjoy 100% relief of her low back pain.  Just like on the 10/21/2021 encounter, she refers that she still not taking any of the oxycodone.  Since she is doing so well, today I will not be setting up follow-up appointment, I simply told the patient to give uKoreaa call when the pain returns.  She understood and agree.  She refers that she still has the original 30 tablets from her 10/21/2021 prescription.  Nonopioids transferred 06/12/2021: Lyrica  Pharmacotherapy Assessment   Analgesic: Oxycodone IR 5 mg, 1 tab p.o. twice daily (10 mg/day of oxycodone) (15 MME/day) MME/day: 15 mg/day   Monitoring:  PMP: PDMP  reviewed during this  encounter.       Pharmacotherapy: No side-effects or adverse reactions reported. Compliance: No problems identified. Effectiveness: Clinically acceptable. Plan: Refer to "POC". UDS:  Summary  Date Value Ref Range Status  06/12/2021 Note  Final    Comment:    ==================================================================== Compliance Drug Analysis, Ur ==================================================================== Test                             Result       Flag       Units  Drug Present and Declared for Prescription Verification   Oxymorphone                    853          EXPECTED   ng/mg creat   Noroxycodone                   926          EXPECTED   ng/mg creat   Noroxymorphone                 219          EXPECTED   ng/mg creat    Oxymorphone, noroxycodone and noroxymorphone are expected    metabolites of oxycodone. Noroxymorphone is an expected metabolite    of oxymorphone. Sources of oxycodone and/or oxymorphone include    scheduled prescription medications.    Pregabalin                     PRESENT      EXPECTED   Trazodone                      PRESENT      EXPECTED   1,3 chlorophenyl piperazine    PRESENT      EXPECTED    1,3-chlorophenyl piperazine is an expected metabolite of trazodone.  Drug Absent but Declared for Prescription Verification   Oxycodone                      Not Detected UNEXPECTED ng/mg creat    Oxycodone is almost always present in patients taking this drug    consistently.  Absence of oxycodone could be due to lapse of time    since the last dose or unusual pharmacokinetics (rapid metabolism).    Duloxetine                     Not Detected UNEXPECTED ==================================================================== Test                      Result    Flag   Units      Ref Range   Creatinine              47               mg/dL      >=20 ==================================================================== Declared Medications:  The  flagging and interpretation on this report are based on the  following declared medications.  Unexpected results may arise from  inaccuracies in the declared medications.   **Note: The testing scope of this panel includes these medications:   Duloxetine  Oxycodone  Pregabalin  Trazodone   **Note: The testing scope of this panel does not include the  following reported medications:   Buspirone  Naloxone  Olmesartan (Benicar)  Pantoprazole  Torsemide  Vitamin D2 ==================================================================== For clinical consultation, please call [phone]. ====================================================================      Laboratory Chemistry Profile   Renal Lab Results  Component Value Date   BUN 29 (H) 01/09/2021   CREATININE 1.60 (H) 01/09/2021   GFRNONAA 33 (L) 01/09/2021    Hepatic Lab Results  Component Value Date   AST 19 01/09/2021   ALT 14 01/09/2021   ALBUMIN 4.0 01/09/2021   ALKPHOS 84 01/09/2021    Electrolytes Lab Results  Component Value Date   NA 136 01/09/2021   K 3.9 01/09/2021   CL 101 01/09/2021   CALCIUM 8.9 01/09/2021   MG 2.6 (H) 03/13/2021    Bone Lab Results  Component Value Date   VD25OH 14.23 (L) 03/13/2021    Inflammation (CRP: Acute Phase) (ESR: Chronic Phase) Lab Results  Component Value Date   CRP 0.5 03/13/2021   ESRSEDRATE 28 03/13/2021         Note: Above Lab results reviewed.  Imaging  DG PAIN CLINIC C-ARM 1-60 MIN NO REPORT Fluoro was used, but no Radiologist interpretation will be provided.  Please refer to "NOTES" tab for provider progress note.  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  Plan of Care  Problem-specific:  No problem-specific Assessment & Plan notes found for this encounter.  Grace Zimmerman has a current medication list which includes the following long-term medication(s): amlodipine, duloxetine, duloxetine, duloxetine, olmesartan, oxycodone, pantoprazole, torsemide, trazodone, trazodone, ergocalciferol, and olmesartan.  Pharmacotherapy (Medications Ordered): No orders of the defined types were placed in this encounter.  Orders:  No orders of the defined types were placed in this encounter.  Follow-up plan:   Return if symptoms worsen or fail to improve.     Interventional Therapies  Risk  Complexity Considerations:   Estimated body mass index is 37.93 kg/m as calculated from the following:   Height as of this encounter: 5' 6"  (1.676 m).   Weight as of this encounter: 235 lb (106.6 kg). Allergy: Latex  Anxiety and depression disorder    Planned  Pending:   Pending further evaluation   Under consideration:   Diagnostic bilateral lumbar facet MBB #2  Diagnostic bilateral lumbar facet RFA  Diagnostic bilateral IA shoulder joint injection  Diagnostic bilateral suprascapular NB  Diagnostic left genicular NB     Completed at other practices:   Failed Epidural Neurostimulator Implant (SCS)  Procedures done by Haskel Khan, MD: Therapeutic bilateral L3-4 facet joint injection x2 (08/02/2018, 11/08/2018)  Therapeutic bilateral L4 TFESI x4 (07/18/2013, 04/03/2014, 05/29/2014, 12/04/2014)  Therapeutic bilateral L5 TFESI x4 (07/18/2013, 04/03/2014, 05/29/2014, 12/04/2014)  Therapeutic right L4-5 and L5-S1 posterior hemilaminectomy with facetectomy and foraminotomy (02/01/2015)  Procedures done by Shirlyn Goltz, MD: Therapeutic right L3-5  medial branch RFA x1 (12/06/2020)  Therapeutic right L4, L5, S1 MBB x1 (11/12/2020) Therapeutic right L3, L4, L5 MBB x1 (10/10/2020)   Completed:   Diagnostic bilateral lumbar facet MBB x1 (10/03/2021) (100/100/100/100)    Therapeutic  Palliative (PRN) options:   Therapeutic/palliative bilateral lumbar facet MBB  Recent Visits Date Type Provider Dept  10/21/21 Office Visit Milinda Pointer, MD [facility]  10/03/21 Procedure visit Milinda Pointer, MD [facility]  09/11/21 Office Visit Milinda Pointer, MD [facility]  Showing recent visits within past 90 days and meeting all other requirements Today's Visits Date Type Provider Dept  11/07/21 Office Visit Milinda Pointer, MD [facility]  Showing today's visits and meeting all other requirements Future Appointments No visits were found meeting these conditions. Showing future appointments within next 90 days and meeting all other requirements I discussed the assessment and treatment plan with the patient. The patient was provided an opportunity to ask questions and all were answered. The patient agreed with the plan and demonstrated an understanding of the instructions.  Patient advised to call back or seek an in-person evaluation if the symptoms or condition worsens.  Duration of encounter: 12 minutes.  Note by: Gaspar Cola, MD Date: 11/07/2021; Time: 7:09 PM

## 2021-11-07 ENCOUNTER — Encounter: Payer: Self-pay | Admitting: Pain Medicine

## 2021-11-07 ENCOUNTER — Other Ambulatory Visit: Payer: Self-pay

## 2021-11-07 ENCOUNTER — Ambulatory Visit: Payer: Medicare HMO | Attending: Pain Medicine | Admitting: Pain Medicine

## 2021-11-07 DIAGNOSIS — Z79899 Other long term (current) drug therapy: Secondary | ICD-10-CM | POA: Diagnosis not present

## 2021-11-07 DIAGNOSIS — M25512 Pain in left shoulder: Secondary | ICD-10-CM

## 2021-11-07 DIAGNOSIS — I129 Hypertensive chronic kidney disease with stage 1 through stage 4 chronic kidney disease, or unspecified chronic kidney disease: Secondary | ICD-10-CM | POA: Diagnosis not present

## 2021-11-07 DIAGNOSIS — Z6839 Body mass index (BMI) 39.0-39.9, adult: Secondary | ICD-10-CM | POA: Diagnosis not present

## 2021-11-07 DIAGNOSIS — M79604 Pain in right leg: Secondary | ICD-10-CM | POA: Diagnosis not present

## 2021-11-07 DIAGNOSIS — M25562 Pain in left knee: Secondary | ICD-10-CM

## 2021-11-07 DIAGNOSIS — M792 Neuralgia and neuritis, unspecified: Secondary | ICD-10-CM

## 2021-11-07 DIAGNOSIS — M79605 Pain in left leg: Secondary | ICD-10-CM

## 2021-11-07 DIAGNOSIS — M25551 Pain in right hip: Secondary | ICD-10-CM

## 2021-11-07 DIAGNOSIS — Z79891 Long term (current) use of opiate analgesic: Secondary | ICD-10-CM | POA: Diagnosis not present

## 2021-11-07 DIAGNOSIS — M25511 Pain in right shoulder: Secondary | ICD-10-CM | POA: Diagnosis not present

## 2021-11-07 DIAGNOSIS — G8929 Other chronic pain: Secondary | ICD-10-CM

## 2021-11-07 DIAGNOSIS — N183 Chronic kidney disease, stage 3 unspecified: Secondary | ICD-10-CM | POA: Diagnosis not present

## 2021-11-07 DIAGNOSIS — M545 Low back pain, unspecified: Secondary | ICD-10-CM

## 2021-11-07 DIAGNOSIS — M25552 Pain in left hip: Secondary | ICD-10-CM

## 2021-11-07 DIAGNOSIS — E785 Hyperlipidemia, unspecified: Secondary | ICD-10-CM | POA: Diagnosis not present

## 2021-11-07 DIAGNOSIS — G894 Chronic pain syndrome: Secondary | ICD-10-CM

## 2021-11-07 DIAGNOSIS — F325 Major depressive disorder, single episode, in full remission: Secondary | ICD-10-CM | POA: Diagnosis not present

## 2021-11-07 DIAGNOSIS — Z96643 Presence of artificial hip joint, bilateral: Secondary | ICD-10-CM

## 2021-11-07 DIAGNOSIS — G8928 Other chronic postprocedural pain: Secondary | ICD-10-CM

## 2021-11-18 ENCOUNTER — Ambulatory Visit: Payer: Medicare HMO | Admitting: Psychiatry
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
## 2021-12-02 ENCOUNTER — Encounter: Payer: Self-pay | Admitting: Psychiatry

## 2021-12-02 ENCOUNTER — Ambulatory Visit (INDEPENDENT_AMBULATORY_CARE_PROVIDER_SITE_OTHER): Payer: Medicare HMO | Admitting: Psychiatry

## 2021-12-02 ENCOUNTER — Other Ambulatory Visit: Payer: Self-pay

## 2021-12-02 VITALS — BP 118/69 | HR 82 | Temp 97.8°F | Wt 238.8 lb

## 2021-12-02 DIAGNOSIS — F331 Major depressive disorder, recurrent, moderate: Secondary | ICD-10-CM

## 2021-12-02 DIAGNOSIS — F419 Anxiety disorder, unspecified: Secondary | ICD-10-CM | POA: Diagnosis not present

## 2021-12-02 MED ORDER — DULOXETINE HCL 60 MG PO CPEP: 120.0000 mg | ORAL_CAPSULE | Freq: Every day | ORAL | 0 refills | Status: DC

## 2021-12-02 NOTE — Patient Instructions (Signed)
Increase duloxetine 120 mg daily Next appointment- 1/23 at 11:30

## 2021-12-07 ENCOUNTER — Other Ambulatory Visit: Payer: Self-pay | Admitting: Psychiatry

## 2021-12-30 DIAGNOSIS — R42 Dizziness and giddiness: Secondary | ICD-10-CM | POA: Diagnosis not present

## 2021-12-30 DIAGNOSIS — M25511 Pain in right shoulder: Secondary | ICD-10-CM | POA: Diagnosis not present

## 2021-12-30 DIAGNOSIS — J069 Acute upper respiratory infection, unspecified: Secondary | ICD-10-CM | POA: Diagnosis not present

## 2021-12-30 DIAGNOSIS — M25512 Pain in left shoulder: Secondary | ICD-10-CM | POA: Diagnosis not present

## 2021-12-31 DIAGNOSIS — L821 Other seborrheic keratosis: Secondary | ICD-10-CM | POA: Diagnosis not present

## 2021-12-31 DIAGNOSIS — L570 Actinic keratosis: Secondary | ICD-10-CM | POA: Diagnosis not present

## 2021-12-31 DIAGNOSIS — D2230 Melanocytic nevi of unspecified part of face: Secondary | ICD-10-CM | POA: Diagnosis not present

## 2021-12-31 DIAGNOSIS — L219 Seborrheic dermatitis, unspecified: Secondary | ICD-10-CM | POA: Diagnosis not present

## 2021-12-31 DIAGNOSIS — D224 Melanocytic nevi of scalp and neck: Secondary | ICD-10-CM | POA: Diagnosis not present

## 2022-01-01 DIAGNOSIS — M25511 Pain in right shoulder: Secondary | ICD-10-CM | POA: Diagnosis not present

## 2022-01-01 DIAGNOSIS — M25512 Pain in left shoulder: Secondary | ICD-10-CM | POA: Diagnosis not present

## 2022-01-02 ENCOUNTER — Other Ambulatory Visit: Payer: Self-pay | Admitting: Internal Medicine

## 2022-01-02 DIAGNOSIS — K219 Gastro-esophageal reflux disease without esophagitis: Secondary | ICD-10-CM | POA: Diagnosis not present

## 2022-01-02 DIAGNOSIS — K5903 Drug induced constipation: Secondary | ICD-10-CM | POA: Diagnosis not present

## 2022-01-02 DIAGNOSIS — R140 Abdominal distension (gaseous): Secondary | ICD-10-CM

## 2022-01-02 DIAGNOSIS — R110 Nausea: Secondary | ICD-10-CM | POA: Diagnosis not present

## 2022-01-02 DIAGNOSIS — T402X5A Adverse effect of other opioids, initial encounter: Secondary | ICD-10-CM | POA: Diagnosis not present

## 2022-01-06 DIAGNOSIS — M25511 Pain in right shoulder: Secondary | ICD-10-CM | POA: Diagnosis not present

## 2022-01-06 DIAGNOSIS — M25512 Pain in left shoulder: Secondary | ICD-10-CM | POA: Diagnosis not present

## 2022-01-09 DIAGNOSIS — M25511 Pain in right shoulder: Secondary | ICD-10-CM | POA: Diagnosis not present

## 2022-01-09 DIAGNOSIS — M25512 Pain in left shoulder: Secondary | ICD-10-CM | POA: Diagnosis not present
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
## 2022-01-10 ENCOUNTER — Other Ambulatory Visit: Payer: Self-pay

## 2022-01-10 ENCOUNTER — Ambulatory Visit
Admission: RE | Admit: 2022-01-10 | Discharge: 2022-01-10 | Disposition: A | Discharge status: Home / Self Care | Payer: Medicare HMO | Source: Ambulatory Visit | Attending: Internal Medicine | Admitting: Internal Medicine

## 2022-01-10 DIAGNOSIS — R110 Nausea: Secondary | ICD-10-CM | POA: Diagnosis not present

## 2022-01-10 DIAGNOSIS — R140 Abdominal distension (gaseous): Secondary | ICD-10-CM

## 2022-01-13 ENCOUNTER — Telehealth (INDEPENDENT_AMBULATORY_CARE_PROVIDER_SITE_OTHER): Payer: Medicare HMO | Admitting: Psychiatry

## 2022-01-13 ENCOUNTER — Encounter: Payer: Self-pay | Admitting: Psychiatry

## 2022-01-13 ENCOUNTER — Other Ambulatory Visit: Payer: Self-pay

## 2022-01-13 DIAGNOSIS — G3184 Mild cognitive impairment, so stated: Secondary | ICD-10-CM | POA: Diagnosis not present

## 2022-01-13 DIAGNOSIS — F3341 Major depressive disorder, recurrent, in partial remission: Secondary | ICD-10-CM | POA: Diagnosis not present

## 2022-01-13 NOTE — Addendum Note (Signed)
Addended by: Norman Clay on: 01/13/2022 05:26 PM   Modules accepted: Orders

## 2022-01-13 NOTE — Patient Instructions (Signed)
Increase duloxetine 120 mg daily Obtain labs (folate, Vitamin B 12) Next appointment- 3/7 at 1:40

## 2022-01-14 DIAGNOSIS — M25511 Pain in right shoulder: Secondary | ICD-10-CM | POA: Diagnosis not present

## 2022-01-14 DIAGNOSIS — M25512 Pain in left shoulder: Secondary | ICD-10-CM | POA: Diagnosis not present

## 2022-01-20 DIAGNOSIS — M25512 Pain in left shoulder: Secondary | ICD-10-CM | POA: Diagnosis not present

## 2022-01-20 DIAGNOSIS — M25511 Pain in right shoulder: Secondary | ICD-10-CM | POA: Diagnosis not present

## 2022-01-23 DIAGNOSIS — M25512 Pain in left shoulder: Secondary | ICD-10-CM | POA: Diagnosis not present

## 2022-01-23 DIAGNOSIS — M25511 Pain in right shoulder: Secondary | ICD-10-CM | POA: Diagnosis not present

## 2022-02-04 DIAGNOSIS — M25512 Pain in left shoulder: Secondary | ICD-10-CM | POA: Diagnosis not present

## 2022-02-04 DIAGNOSIS — M25511 Pain in right shoulder: Secondary | ICD-10-CM | POA: Diagnosis not present
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
## 2022-02-25 ENCOUNTER — Telehealth (INDEPENDENT_AMBULATORY_CARE_PROVIDER_SITE_OTHER): Payer: Medicare HMO | Admitting: Psychiatry

## 2022-02-25 ENCOUNTER — Other Ambulatory Visit: Payer: Self-pay

## 2022-02-25 ENCOUNTER — Encounter: Payer: Self-pay | Admitting: Psychiatry

## 2022-02-25 DIAGNOSIS — G3184 Mild cognitive impairment, so stated: Secondary | ICD-10-CM

## 2022-02-25 DIAGNOSIS — F3341 Major depressive disorder, recurrent, in partial remission: Secondary | ICD-10-CM | POA: Diagnosis not present

## 2022-02-25 MED ORDER — DULOXETINE HCL 60 MG PO CPEP: 120.0000 mg | ORAL_CAPSULE | Freq: Every day | ORAL | 0 refills | Status: DC

## 2022-02-25 NOTE — Patient Instructions (Signed)
Continue duloxetine 120 mg daily ?Obtain labs (folate, Vitamin B 12) ?Next appointment-  6/6 at 1 PM, video ?

## 2022-03-04 ENCOUNTER — Other Ambulatory Visit: Payer: Self-pay | Admitting: Psychiatry

## 2022-03-18 ENCOUNTER — Encounter: Payer: Self-pay | Admitting: Internal Medicine

## 2022-03-19 ENCOUNTER — Encounter: Payer: Self-pay | Admitting: Internal Medicine

## 2022-03-19 ENCOUNTER — Encounter
Admission: RE | Disposition: A | Discharge status: Home / Self Care | Payer: Self-pay | Source: Home / Self Care | Attending: Internal Medicine

## 2022-03-19 ENCOUNTER — Ambulatory Visit: Payer: Medicare HMO | Admitting: Anesthesiology

## 2022-03-19 ENCOUNTER — Ambulatory Visit
Admission: RE | Admit: 2022-03-19 | Discharge: 2022-03-19 | Disposition: A | Discharge status: Home / Self Care | Payer: Medicare HMO | Attending: Internal Medicine | Admitting: Internal Medicine

## 2022-03-19 DIAGNOSIS — Z96643 Presence of artificial hip joint, bilateral: Secondary | ICD-10-CM | POA: Insufficient documentation

## 2022-03-19 DIAGNOSIS — R140 Abdominal distension (gaseous): Secondary | ICD-10-CM | POA: Insufficient documentation

## 2022-03-19 DIAGNOSIS — K3189 Other diseases of stomach and duodenum: Secondary | ICD-10-CM | POA: Diagnosis not present

## 2022-03-19 DIAGNOSIS — I10 Essential (primary) hypertension: Secondary | ICD-10-CM | POA: Insufficient documentation

## 2022-03-19 DIAGNOSIS — Z6837 Body mass index (BMI) 37.0-37.9, adult: Secondary | ICD-10-CM | POA: Insufficient documentation

## 2022-03-19 DIAGNOSIS — K449 Diaphragmatic hernia without obstruction or gangrene: Secondary | ICD-10-CM | POA: Diagnosis not present

## 2022-03-19 DIAGNOSIS — K219 Gastro-esophageal reflux disease without esophagitis: Secondary | ICD-10-CM | POA: Diagnosis not present

## 2022-03-19 DIAGNOSIS — R110 Nausea: Secondary | ICD-10-CM | POA: Insufficient documentation

## 2022-03-19 DIAGNOSIS — K2970 Gastritis, unspecified, without bleeding: Secondary | ICD-10-CM | POA: Diagnosis not present

## 2022-03-19 DIAGNOSIS — Z96653 Presence of artificial knee joint, bilateral: Secondary | ICD-10-CM | POA: Insufficient documentation

## 2022-03-19 DIAGNOSIS — E669 Obesity, unspecified: Secondary | ICD-10-CM | POA: Diagnosis not present

## 2022-03-19 DIAGNOSIS — F32A Depression, unspecified: Secondary | ICD-10-CM | POA: Diagnosis not present

## 2022-03-19 DIAGNOSIS — K5900 Constipation, unspecified: Secondary | ICD-10-CM | POA: Diagnosis not present

## 2022-03-19 DIAGNOSIS — K317 Polyp of stomach and duodenum: Secondary | ICD-10-CM | POA: Diagnosis not present

## 2022-03-19 DIAGNOSIS — F419 Anxiety disorder, unspecified: Secondary | ICD-10-CM | POA: Insufficient documentation

## 2022-03-19 DIAGNOSIS — D131 Benign neoplasm of stomach: Secondary | ICD-10-CM | POA: Diagnosis not present

## 2022-03-19 HISTORY — PX: ESOPHAGOGASTRODUODENOSCOPY: SHX5428

## 2022-03-19 HISTORY — DX: Spondylosis without myelopathy or radiculopathy, cervical region: M47.812

## 2022-03-19 HISTORY — DX: Unspecified osteoarthritis, unspecified site: M19.90

## 2022-03-19 SURGERY — EGD (ESOPHAGOGASTRODUODENOSCOPY)
Anesthesia: General

## 2022-03-19 MED ORDER — PROPOFOL 500 MG/50ML IV EMUL
INTRAVENOUS | Status: DC | PRN
  Administered 2022-03-19: 200 ug/kg/min via INTRAVENOUS

## 2022-03-19 MED ORDER — SODIUM CHLORIDE 0.9 % IV SOLN
INTRAVENOUS | Status: DC
  Administered 2022-03-19: 20 mL/h via INTRAVENOUS

## 2022-03-19 MED ORDER — LACTATED RINGERS IV SOLN: INTRAVENOUS | Status: DC | PRN

## 2022-03-19 MED ORDER — PROPOFOL 10 MG/ML IV BOLUS
INTRAVENOUS | Status: DC | PRN
  Administered 2022-03-19: 10 mg via INTRAVENOUS
  Administered 2022-03-19: 50 mg via INTRAVENOUS
  Administered 2022-03-19: 10 mg via INTRAVENOUS

## 2022-03-19 NOTE — Transfer of Care (Signed)
Immediate Anesthesia Transfer of Care Note ? ?Patient: Grace Zimmerman ? ?Procedure(s) Performed: ESOPHAGOGASTRODUODENOSCOPY (EGD) ? ?Patient Location: PACU ? ?Anesthesia Type:General ? ?Level of Consciousness: awake, alert  and oriented ? ?Airway & Oxygen Therapy: Patient Spontanous Breathing and Patient connected to nasal cannula oxygen ? ?Post-op Assessment: Report given to RN and Post -op Vital signs reviewed and stable ? ?Post vital signs: Reviewed and stable ? ?Last Vitals:  ?Vitals Value Taken Time  ?BP 124/96 03/19/22 1150  ?Temp 36.2 ?C 03/19/22 1150  ?Pulse 88 03/19/22 1150  ?Resp    ?SpO2 99 % 03/19/22 1150  ? ? ?Last Pain:  ?Vitals:  ? 03/19/22 1150  ?TempSrc: Temporal  ?PainSc: 0-No pain  ?   ? ?  ? ?Complications: No notable events documented. ?

## 2022-03-19 NOTE — Op Note (Signed)
[facility] ?Gastroenterology ?Patient Name: Grace Zimmerman ?Procedure Date: 03/19/2022 11:15 AM ?MRN: [id number] ?Account #: [id number] ?Date of Birth: 1946-12-13 ?Admit Type: Outpatient ?Age: 76 ?Room: [facility] ENDO ROOM 2 ?Gender: Female ?Note Status: Finalized ?Instrument Name: Upper-Endoscope [id number] ?Procedure:             Upper GI endoscopy ?Indications:           Gastro-esophageal reflux disease, Abdominal bloating,  ?                       Nausea ?Providers:             Benay Pike. Dior Stepter MD, MD ?Medicines:             Propofol per Anesthesia ?Complications:         No immediate complications. ?Procedure:             Pre-Anesthesia Assessment: ?                       - The risks and benefits of the procedure and the  ?                       sedation options and risks were discussed with the  ?                       patient. All questions were answered and informed  ?                       consent was obtained. ?                       - Patient identification and proposed procedure were  ?                       verified prior to the procedure by the nurse. The  ?                       procedure was verified in the procedure room. ?                       - ASA Grade Assessment: III - A patient with severe  ?                       systemic disease. ?                       - After reviewing the risks and benefits, the patient  ?                       was deemed in satisfactory condition to undergo the  ?                       procedure. ?                       After obtaining informed consent, the endoscope was  ?                       passed under direct vision. Throughout the procedure,  ?  the patient's blood pressure, pulse, and oxygen  ?                       saturations were monitored continuously. The Endoscope  ?                       was introduced through the mouth, and advanced to the  ?                       third part of duodenum. The upper GI endoscopy was  ?                        accomplished without difficulty. The patient tolerated  ?                       the procedure well. ?Findings: ?     The esophagus was normal. ?     A single 7 mm sessile polyp with no bleeding and no stigmata of recent  ?     bleeding was found in the cardia. Biopsies were taken with a cold  ?     forceps for histology. ?     Multiple 5 to 10 mm sessile polyps with no bleeding and no stigmata of  ?     recent bleeding were found in the gastric body. Biopsies were taken with  ?     a cold forceps for histology. ?     Patchy mildly erythematous mucosa without bleeding was found in the  ?     gastric antrum. Biopsies were taken with a cold forceps for Helicobacter  ?     pylori testing. ?     A 2 cm hiatal hernia was present. ?     The examined duodenum was normal. ?     The exam was otherwise without abnormality. ?Impression:            - Normal esophagus. ?                       - A single gastric polyp. Biopsied. ?                       - Multiple gastric polyps. Biopsied. ?                       - Erythematous mucosa in the antrum. Biopsied. ?                       - 2 cm hiatal hernia. ?                       - Normal examined duodenum. ?                       - The examination was otherwise normal. ?Recommendation:        - Patient has a contact number available for  ?                       emergencies. The signs and symptoms of potential  ?                       delayed complications were discussed with  the patient.  ?                       Return to normal activities tomorrow. Written  ?                       discharge instructions were provided to the patient. ?                       - Resume previous diet. ?                       - Continue present medications. ?                       - Await pathology results. ?                       - Return to my office in 3 months. ?                       - The findings and recommendations were discussed with  ?                       the  patient. ?Procedure Code(s):     --- Professional --- ?                       [phone], Esophagogastroduodenoscopy, flexible,  ?                       transoral; with biopsy, single or multiple ?Diagnosis Code(s):     --- Professional --- ?                       R11.0, Nausea ?                       R14.0, Abdominal distension (gaseous) ?                       K21.9, Gastro-esophageal reflux disease without  ?                       esophagitis ?                       K44.9, Diaphragmatic hernia without obstruction or  ?                       gangrene ?                       K31.89, Other diseases of stomach and duodenum ?                       K31.7, Polyp of stomach and duodenum ?CPT copyright 2019 American Medical Association. All rights reserved. ?The codes documented in this report are preliminary and upon coder review may  ?be revised to meet current compliance requirements. ?Efrain Sella MD, MD ?03/19/2022 11:51:00 AM ?This report has been signed electronically. ?Number of Addenda: 0 ?Note Initiated On: 03/19/2022 11:15 AM ?Estimated Blood Loss:  Estimated blood loss: none. Estimated blood loss: none. ?     [facility] ?

## 2022-03-19 NOTE — Anesthesia Postprocedure Evaluation (Signed)
Anesthesia Post Note ? ?Patient: Grace Zimmerman ? ?Procedure(s) Performed: ESOPHAGOGASTRODUODENOSCOPY (EGD) ? ?Patient location during evaluation: Endoscopy ?Anesthesia Type: General ?Level of consciousness: awake and alert ?Pain management: pain level controlled ?Vital Signs Assessment: post-procedure vital signs reviewed and stable ?Respiratory status: spontaneous breathing, nonlabored ventilation, respiratory function stable and patient connected to nasal cannula oxygen ?Cardiovascular status: blood pressure returned to baseline and stable ?Postop Assessment: no apparent nausea or vomiting ?Anesthetic complications: no ? ? ?No notable events documented. ? ? ?Last Vitals:  ?Vitals:  ? 03/19/22 1200 03/19/22 1213  ?BP:  91/70  ?Pulse: 78 79  ?Resp: 18 15  ?Temp:    ?SpO2: 98% 100%  ?  ?Last Pain:  ?Vitals:  ? 03/19/22 1213  ?TempSrc:   ?PainSc: 0-No pain  ? ? ?  ?  ?  ?  ?  ?  ? ?Precious Haws Jabari Swoveland ? ? ? ? ?

## 2022-03-19 NOTE — H&P (Signed)
?Outpatient short stay form Pre-procedure ?03/19/2022 11:23 AM ?Lorie Apley K. Alice Reichert, M.D. ? ?Primary Physician: Frazier Richards,  M.D. ? ?Reason for visit:  Nausea, GERD, abdominal bloating ? ?History of present illness:  [name] reports a chronic hx of GERD controlled with PPI for several years. She denies dysphagia, hemetemesis, melena or hx of PUD.  ?Patient has acute on chronic bloating and some sensation of incomplete defecation while on oxycodone for chronic pain syndrome. Patient takes miralax but does not like the side effects of "splattering" and "messiness".  ?Patient had a colonoscopy in 01/2017 that was performed at [facility] that was reviewed and noted to be completely normal.  ?Patient has not previously undergone upper endoscopy for Barrett's esophagus screening. ? ?EGD was ordered but cancelled by patient for 07/10/21: ? ?I gave the patient a prescription for LInzess 290 mcg that was too strong causing diarrhea on phone followup, so the dose was decreased to 164mg.   ? ?Interval History  ? ?[name] presents today for follow-up of constipation as well as complaint of persistent nausea and abdominal bloating/discomfort. Patient canceled her endoscopy in May 2022 and now wants to reschedule it. Nausea appears to be occurring with every meal and is accompanied by diffuse abdominal bloating without any specific right upper quadrant pain. Patient still has a retained gallbladder. She denies any significant heartburn but does take pantoprazole 40 mg daily according to her husband. She has type 2 diabetes mellitus and obesity. ? ? ? ?Current Facility-Administered Medications:  ?  0.9 %  sodium chloride infusion, , Intravenous, Continuous, [place] TBenay Pike MD, Last Rate: 20 mL/hr at 03/19/22 1053, 20 mL/hr at 03/19/22 1053 ? ?Medications Prior to Admission  ?Medication Sig Dispense Refill Last Dose  ? amLODipine (NORVASC) 2.5 MG tablet Take 1 tablet by mouth daily.   03/18/2022  ? DULoxetine (CYMBALTA) 60  MG capsule Take 2 capsules (120 mg total) by mouth daily. 180 capsule 0 03/18/2022  ? naloxone (NARCAN) nasal spray 4 mg/0.1 mL Place 1 spray into the nose as needed for up to 365 doses (for opioid-induced respiratory depresssion). In case of emergency (overdose), spray once into each nostril. If no response within 3 minutes, repeat application and call [phone] 1 each 0 Past Month  ? olmesartan (BENICAR) 40 MG tablet Take by mouth.   03/18/2022  ? pantoprazole (PROTONIX) 40 MG tablet Take 1 tablet by mouth daily.   03/18/2022  ? torsemide (DEMADEX) 5 MG tablet Take 10 mg by mouth daily.   03/18/2022  ? traZODone (DESYREL) 150 MG tablet Take 300 mg by mouth at bedtime.   03/18/2022  ? ergocalciferol (VITAMIN D2) 1.25 MG (50000 UT) capsule Take 1 capsule (50,000 Units total) by mouth 2 (two) times a week. X 6 weeks. 12 capsule 0   ? olmesartan (BENICAR) 40 MG tablet Take 40 mg by mouth daily.     ? ? ? ?Allergies  ?Allergen Reactions  ? Percocet [Oxycodone-Acetaminophen]   ? Latex Rash  ? ? ? ?Past Medical History:  ?Diagnosis Date  ? Anxiety   ? Arthritis   ? Degenerative joint disease of cervical spine   ? Depression   ? Hypertension   ? ? ?Review of systems:  Otherwise negative.  ? ? ?Physical Exam ? ?Gen: Alert, oriented. Appears stated age.  ?HEENT: [place]/AT. PERRLA. ?Lungs: CTA, no wheezes. ?CV: RR nl S1, S2. ?Abd: soft, benign, no masses. BS+ ?Ext: No edema. Pulses 2+ ? ? ? ?Planned procedures: Proceed with EGD. The  patient understands the nature of the planned procedure, indications, risks, alternatives and potential complications including but not limited to bleeding, infection, perforation, damage to internal organs and possible oversedation/side effects from anesthesia. The patient agrees and gives consent to proceed.  ?Please refer to procedure notes for findings, recommendations and patient disposition/instructions.  ? ? ? ?Shayona Hibbitts K. Alice Reichert, M.D. ?Gastroenterology ?03/19/2022  11:23 AM ? ? ? ? ? ?

## 2022-03-19 NOTE — Anesthesia Preprocedure Evaluation (Signed)
Anesthesia Evaluation  ?Patient identified by MRN, date of birth, ID band ?Patient awake ? ? ? ?Reviewed: ?Allergy & Precautions, NPO status , Patient's Chart, lab work & pertinent test results ? ?Airway ?Mallampati: III ? ?TM Distance: <3 FB ?Neck ROM: full ? ? ? Dental ? ?(+) Chipped, Poor Dentition ?  ?Pulmonary ?neg pulmonary ROS, neg shortness of breath,  ?  ?Pulmonary exam normal ? ? ? ? ? ? ? Cardiovascular ?Exercise Tolerance: Good ?hypertension, (-) angina(-) Past MI Normal cardiovascular exam ? ? ?  ?Neuro/Psych ?PSYCHIATRIC DISORDERS negative neurological ROS ?   ? GI/Hepatic ?Neg liver ROS, GERD  Controlled,  ?Endo/Other  ?negative endocrine ROS ? Renal/GU ?Renal disease  ?negative genitourinary ?  ?Musculoskeletal ? ? Abdominal ?  ?Peds ? Hematology ?negative hematology ROS ?(+)   ?Anesthesia Other Findings ?Past Medical History: ?No date: Anxiety ?No date: Arthritis ?No date: Degenerative joint disease of cervical spine ?No date: Depression ?No date: Hypertension ? ?Past Surgical History: ?No date: ABDOMINAL HYSTERECTOMY ?No date: HAND SURGERY; Bilateral ?    Comment:  CTS ?No date: JOINT REPLACEMENT ?    Comment:  bilateral knee, bilateral hip  ? ?BMI   ? Body Mass Index: 37.12 kg/m?  ?  ? ? Reproductive/Obstetrics ?negative OB ROS ? ?  ? ? ? ? ? ? ? ? ? ? ? ? ? ?  ?  ? ? ? ? ? ? ? ? ?Anesthesia Physical ?Anesthesia Plan ? ?ASA: 3 ? ?Anesthesia Plan: General  ? ?Post-op Pain Management:   ? ?Induction: Intravenous ? ?PONV Risk Score and Plan: Propofol infusion and TIVA ? ?Airway Management Planned: Natural Airway and Nasal Cannula ? ?Additional Equipment:  ? ?Intra-op Plan:  ? ?Post-operative Plan:  ? ?Informed Consent: I have reviewed the patients History and Physical, chart, labs and discussed the procedure including the risks, benefits and alternatives for the proposed anesthesia with the patient or authorized representative who has indicated his/her understanding  and acceptance.  ? ? ? ?Dental Advisory Given ? ?Plan Discussed with: Anesthesiologist, CRNA and Surgeon ? ?Anesthesia Plan Comments: (Patient consented for risks of anesthesia including but not limited to:  ?- adverse reactions to medications ?- risk of airway placement if required ?- damage to eyes, teeth, lips or other oral mucosa ?- nerve damage due to positioning  ?- sore throat or hoarseness ?- Damage to heart, brain, nerves, lungs, other parts of body or loss of life ? ?Patient voiced understanding.)  ? ? ? ? ? ? ?Anesthesia Quick Evaluation ? ?

## 2022-03-19 NOTE — Interval H&P Note (Signed)
History and Physical Interval Note: ? ?03/19/2022 ?11:27 AM ? ?Grace Zimmerman  has presented today for surgery, with the diagnosis of ABD BLOATING ?NAUSEA.  The various methods of treatment have been discussed with the patient and family. After consideration of risks, benefits and other options for treatment, the patient has consented to  Procedure(s): ?ESOPHAGOGASTRODUODENOSCOPY (EGD) (N/A) as a surgical intervention.  The patient's history has been reviewed, patient examined, no change in status, stable for surgery.  I have reviewed the patient's chart and labs.  Questions were answered to the patient's satisfaction.   ? ? ?[place], [place] ? ? ?

## 2022-03-20 LAB — SURGICAL PATHOLOGY

## 2022-04-10 ENCOUNTER — Other Ambulatory Visit: Payer: Self-pay | Admitting: Psychiatry

## 2022-04-30 DIAGNOSIS — N183 Chronic kidney disease, stage 3 unspecified: Secondary | ICD-10-CM | POA: Diagnosis not present

## 2022-04-30 DIAGNOSIS — I129 Hypertensive chronic kidney disease with stage 1 through stage 4 chronic kidney disease, or unspecified chronic kidney disease: Secondary | ICD-10-CM | POA: Diagnosis not present

## 2022-04-30 DIAGNOSIS — E785 Hyperlipidemia, unspecified: Secondary | ICD-10-CM | POA: Diagnosis not present

## 2022-04-30 DIAGNOSIS — R7309 Other abnormal glucose: Secondary | ICD-10-CM | POA: Diagnosis not present

## 2022-04-30 DIAGNOSIS — Z6839 Body mass index (BMI) 39.0-39.9, adult: Secondary | ICD-10-CM | POA: Diagnosis not present

## 2022-05-07 DIAGNOSIS — I129 Hypertensive chronic kidney disease with stage 1 through stage 4 chronic kidney disease, or unspecified chronic kidney disease: Secondary | ICD-10-CM | POA: Diagnosis not present

## 2022-05-07 DIAGNOSIS — Z6837 Body mass index (BMI) 37.0-37.9, adult: Secondary | ICD-10-CM | POA: Diagnosis not present

## 2022-05-07 DIAGNOSIS — M4807 Spinal stenosis, lumbosacral region: Secondary | ICD-10-CM | POA: Diagnosis not present

## 2022-05-07 DIAGNOSIS — F325 Major depressive disorder, single episode, in full remission: Secondary | ICD-10-CM | POA: Diagnosis not present

## 2022-05-07 DIAGNOSIS — E785 Hyperlipidemia, unspecified: Secondary | ICD-10-CM | POA: Diagnosis not present

## 2022-05-07 DIAGNOSIS — N183 Chronic kidney disease, stage 3 unspecified: Secondary | ICD-10-CM | POA: Diagnosis not present

## 2022-05-07 DIAGNOSIS — R270 Ataxia, unspecified: Secondary | ICD-10-CM | POA: Diagnosis not present

## 2022-05-07 DIAGNOSIS — M545 Low back pain, unspecified: Secondary | ICD-10-CM | POA: Diagnosis not present

## 2022-05-07 DIAGNOSIS — Z0000 Encounter for general adult medical examination without abnormal findings: Secondary | ICD-10-CM | POA: Diagnosis not present

## 2022-05-08 ENCOUNTER — Other Ambulatory Visit: Payer: Self-pay | Admitting: Internal Medicine

## 2022-05-08 DIAGNOSIS — M4807 Spinal stenosis, lumbosacral region: Secondary | ICD-10-CM

## 2022-05-21 ENCOUNTER — Ambulatory Visit
Admission: RE | Admit: 2022-05-21 | Discharge: 2022-05-21 | Disposition: A | Discharge status: Home / Self Care | Payer: Medicare HMO | Source: Ambulatory Visit | Attending: Internal Medicine | Admitting: Internal Medicine

## 2022-05-21 DIAGNOSIS — M4807 Spinal stenosis, lumbosacral region: Secondary | ICD-10-CM

## 2022-05-21 DIAGNOSIS — M48061 Spinal stenosis, lumbar region without neurogenic claudication: Secondary | ICD-10-CM | POA: Diagnosis not present
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
## 2022-05-27 ENCOUNTER — Telehealth (INDEPENDENT_AMBULATORY_CARE_PROVIDER_SITE_OTHER): Payer: Medicare HMO | Admitting: Psychiatry

## 2022-05-27 ENCOUNTER — Encounter: Payer: Self-pay | Admitting: Psychiatry

## 2022-05-27 DIAGNOSIS — F3341 Major depressive disorder, recurrent, in partial remission: Secondary | ICD-10-CM | POA: Diagnosis not present

## 2022-05-27 DIAGNOSIS — G3184 Mild cognitive impairment, so stated: Secondary | ICD-10-CM

## 2022-05-27 MED ORDER — DULOXETINE HCL 60 MG PO CPEP: 120.0000 mg | ORAL_CAPSULE | Freq: Every day | ORAL | 0 refills | Status: DC

## 2022-05-27 NOTE — Patient Instructions (Signed)
Continue duloxetine 120 mg daily Obtain labs (folate, Vitamin B 12)  Next appointment-  10/4 at 1 PM

## 2022-06-05 DIAGNOSIS — Z6837 Body mass index (BMI) 37.0-37.9, adult: Secondary | ICD-10-CM | POA: Diagnosis not present

## 2022-06-05 DIAGNOSIS — I129 Hypertensive chronic kidney disease with stage 1 through stage 4 chronic kidney disease, or unspecified chronic kidney disease: Secondary | ICD-10-CM | POA: Diagnosis not present

## 2022-06-05 DIAGNOSIS — F325 Major depressive disorder, single episode, in full remission: Secondary | ICD-10-CM | POA: Diagnosis not present

## 2022-06-05 DIAGNOSIS — E785 Hyperlipidemia, unspecified: Secondary | ICD-10-CM | POA: Diagnosis not present

## 2022-06-05 DIAGNOSIS — R7303 Prediabetes: Secondary | ICD-10-CM | POA: Diagnosis not present

## 2022-06-05 DIAGNOSIS — N183 Chronic kidney disease, stage 3 unspecified: Secondary | ICD-10-CM | POA: Diagnosis not present

## 2022-06-20 DIAGNOSIS — I129 Hypertensive chronic kidney disease with stage 1 through stage 4 chronic kidney disease, or unspecified chronic kidney disease: Secondary | ICD-10-CM | POA: Diagnosis not present

## 2022-06-20 DIAGNOSIS — N183 Chronic kidney disease, stage 3 unspecified: Secondary | ICD-10-CM | POA: Diagnosis not present

## 2022-06-20 DIAGNOSIS — F325 Major depressive disorder, single episode, in full remission: Secondary | ICD-10-CM | POA: Diagnosis not present

## 2022-06-20 DIAGNOSIS — E785 Hyperlipidemia, unspecified: Secondary | ICD-10-CM | POA: Diagnosis not present

## 2022-06-20 DIAGNOSIS — R7303 Prediabetes: Secondary | ICD-10-CM | POA: Diagnosis not present

## 2022-06-20 DIAGNOSIS — Z6838 Body mass index (BMI) 38.0-38.9, adult: Secondary | ICD-10-CM | POA: Diagnosis not present

## 2022-06-25 DIAGNOSIS — N183 Chronic kidney disease, stage 3 unspecified: Secondary | ICD-10-CM | POA: Diagnosis not present

## 2022-06-25 DIAGNOSIS — I129 Hypertensive chronic kidney disease with stage 1 through stage 4 chronic kidney disease, or unspecified chronic kidney disease: Secondary | ICD-10-CM | POA: Diagnosis not present

## 2022-07-01 DIAGNOSIS — M48062 Spinal stenosis, lumbar region with neurogenic claudication: Secondary | ICD-10-CM | POA: Diagnosis not present

## 2022-07-01 DIAGNOSIS — G8929 Other chronic pain: Secondary | ICD-10-CM | POA: Diagnosis not present

## 2022-07-01 DIAGNOSIS — M5441 Lumbago with sciatica, right side: Secondary | ICD-10-CM | POA: Diagnosis not present

## 2022-07-01 DIAGNOSIS — M5442 Lumbago with sciatica, left side: Secondary | ICD-10-CM | POA: Diagnosis not present

## 2022-07-18 DIAGNOSIS — M48062 Spinal stenosis, lumbar region with neurogenic claudication: Secondary | ICD-10-CM | POA: Diagnosis not present

## 2022-07-23 DIAGNOSIS — I129 Hypertensive chronic kidney disease with stage 1 through stage 4 chronic kidney disease, or unspecified chronic kidney disease: Secondary | ICD-10-CM | POA: Diagnosis not present

## 2022-07-23 DIAGNOSIS — F325 Major depressive disorder, single episode, in full remission: Secondary | ICD-10-CM | POA: Diagnosis not present

## 2022-07-23 DIAGNOSIS — N183 Chronic kidney disease, stage 3 unspecified: Secondary | ICD-10-CM | POA: Diagnosis not present

## 2022-08-12 DIAGNOSIS — M5441 Lumbago with sciatica, right side: Secondary | ICD-10-CM | POA: Diagnosis not present

## 2022-08-12 DIAGNOSIS — M48062 Spinal stenosis, lumbar region with neurogenic claudication: Secondary | ICD-10-CM | POA: Diagnosis not present

## 2022-08-12 DIAGNOSIS — M47816 Spondylosis without myelopathy or radiculopathy, lumbar region: Secondary | ICD-10-CM | POA: Diagnosis not present

## 2022-08-12 DIAGNOSIS — M5442 Lumbago with sciatica, left side: Secondary | ICD-10-CM | POA: Diagnosis not present

## 2022-08-12 DIAGNOSIS — G8929 Other chronic pain: Secondary | ICD-10-CM | POA: Diagnosis not present

## 2022-08-14 DIAGNOSIS — M47816 Spondylosis without myelopathy or radiculopathy, lumbar region: Secondary | ICD-10-CM | POA: Diagnosis not present

## 2022-08-19 DIAGNOSIS — G8929 Other chronic pain: Secondary | ICD-10-CM | POA: Diagnosis not present

## 2022-08-19 DIAGNOSIS — M48062 Spinal stenosis, lumbar region with neurogenic claudication: Secondary | ICD-10-CM | POA: Diagnosis not present

## 2022-08-19 DIAGNOSIS — M5441 Lumbago with sciatica, right side: Secondary | ICD-10-CM | POA: Diagnosis not present

## 2022-08-19 DIAGNOSIS — M5442 Lumbago with sciatica, left side: Secondary | ICD-10-CM | POA: Diagnosis not present

## 2022-08-19 DIAGNOSIS — M47816 Spondylosis without myelopathy or radiculopathy, lumbar region: Secondary | ICD-10-CM | POA: Diagnosis not present

## 2022-08-21 DIAGNOSIS — M47816 Spondylosis without myelopathy or radiculopathy, lumbar region: Secondary | ICD-10-CM | POA: Diagnosis not present

## 2022-08-28 DIAGNOSIS — F325 Major depressive disorder, single episode, in full remission: Secondary | ICD-10-CM | POA: Diagnosis not present

## 2022-08-28 DIAGNOSIS — N183 Chronic kidney disease, stage 3 unspecified: Secondary | ICD-10-CM | POA: Diagnosis not present

## 2022-08-28 DIAGNOSIS — I129 Hypertensive chronic kidney disease with stage 1 through stage 4 chronic kidney disease, or unspecified chronic kidney disease: Secondary | ICD-10-CM | POA: Diagnosis not present

## 2022-09-10 DIAGNOSIS — M47816 Spondylosis without myelopathy or radiculopathy, lumbar region: Secondary | ICD-10-CM | POA: Diagnosis not present
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
## 2022-09-24 ENCOUNTER — Encounter: Payer: Self-pay | Admitting: Psychiatry

## 2022-09-24 ENCOUNTER — Telehealth (INDEPENDENT_AMBULATORY_CARE_PROVIDER_SITE_OTHER): Payer: Medicare HMO | Admitting: Psychiatry

## 2022-09-24 DIAGNOSIS — G3184 Mild cognitive impairment, so stated: Secondary | ICD-10-CM | POA: Diagnosis not present

## 2022-09-24 DIAGNOSIS — F330 Major depressive disorder, recurrent, mild: Secondary | ICD-10-CM

## 2022-09-24 MED ORDER — DULOXETINE HCL 60 MG PO CPEP: 120.0000 mg | ORAL_CAPSULE | Freq: Every day | ORAL | 0 refills | Status: AC

## 2022-09-24 MED ORDER — BUPROPION HCL ER (XL) 150 MG PO TB24: 150.0000 mg | ORAL_TABLET | Freq: Every day | ORAL | 0 refills | Status: DC

## 2022-09-24 NOTE — Patient Instructions (Signed)
Continue duloxetine 120 mg daily Start bupropion 150 mg daily  Next appointment-  11/3 at 9;30

## 2022-10-18 ENCOUNTER — Other Ambulatory Visit: Payer: Self-pay | Admitting: Psychiatry

## 2022-10-19 IMAGING — MR MR LUMBAR SPINE W/O CM
4 of 6 series · 20 of 48 positions shown · non-contrast
Comparison: None Available.

CLINICAL DATA: Lumbosacral spinal stenosis

EXAM:
MRI LUMBAR SPINE WITHOUT CONTRAST
TECHNIQUE: Multiplanar, multisequence MR imaging of the lumbar spine was
performed. No intravenous contrast was administered.

[Series 6: T2 · sagittal · 4.0mm · 0.73mm/px · 5 of 15 slices shown (1 of 3)]
[im 1/15]
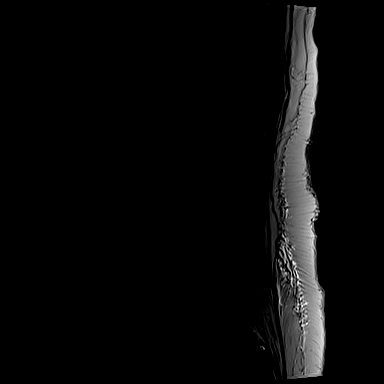
[im 4/15]
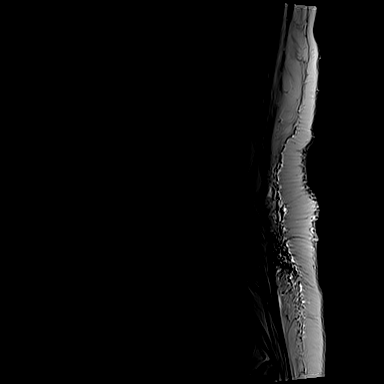
[im 8/15]
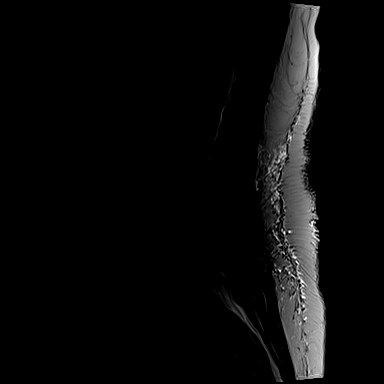
[im 11/15]
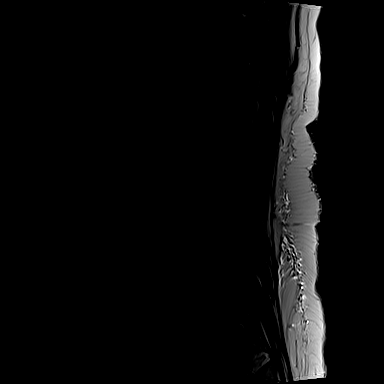
[im 15/15]
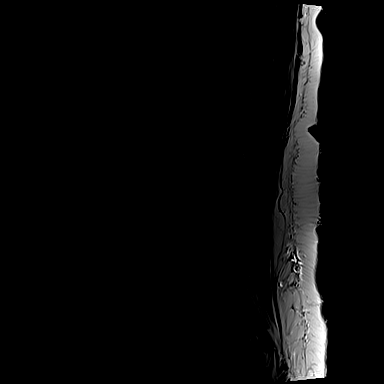

[Series 7: T1 · sagittal · 4.0mm · 0.73mm/px · 3 of 15 slices shown]
[im 1/15]
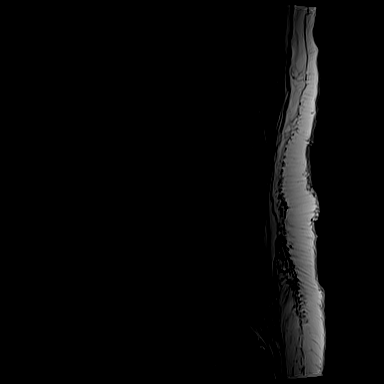
[im 8/15]
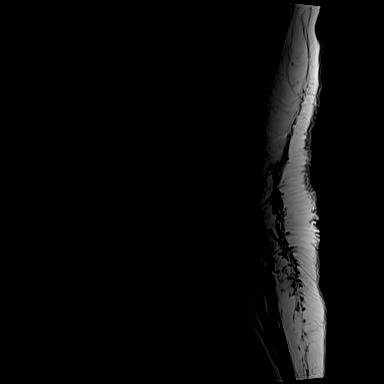
[im 15/15]
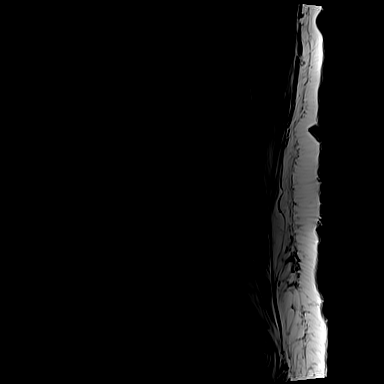

[Series 15: T2 · axial · 4.0mm · 0.28mm/px · z∈[-87,+120]mm · 9 of 37 slices shown (2 of 3)]
[im 1/37]
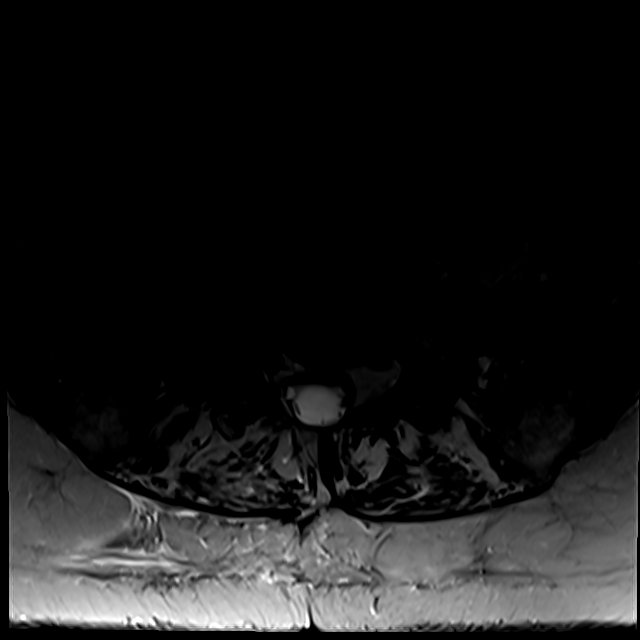
[im 7/37]
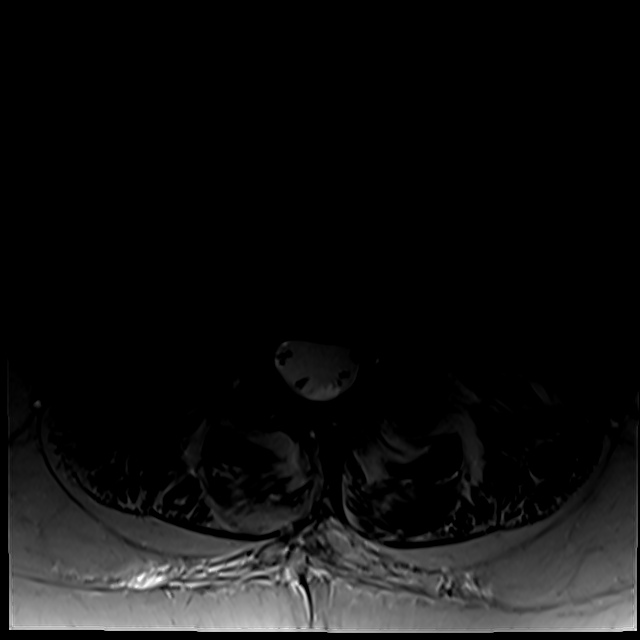
[im 13/37]
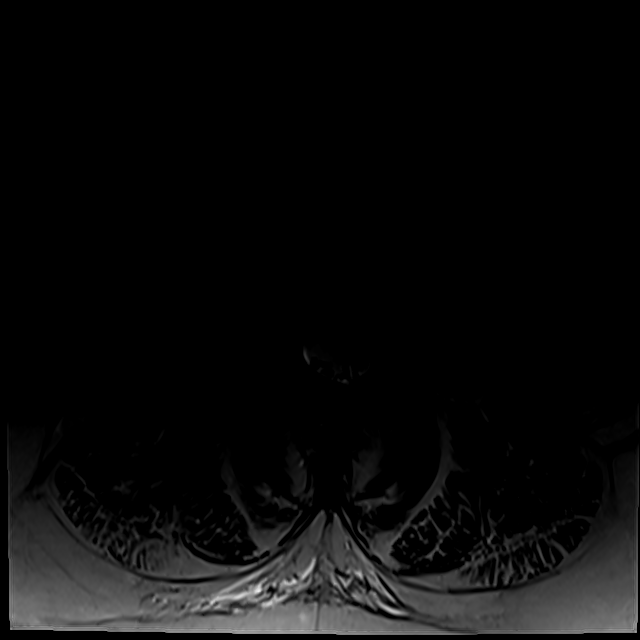
[im 16/37]
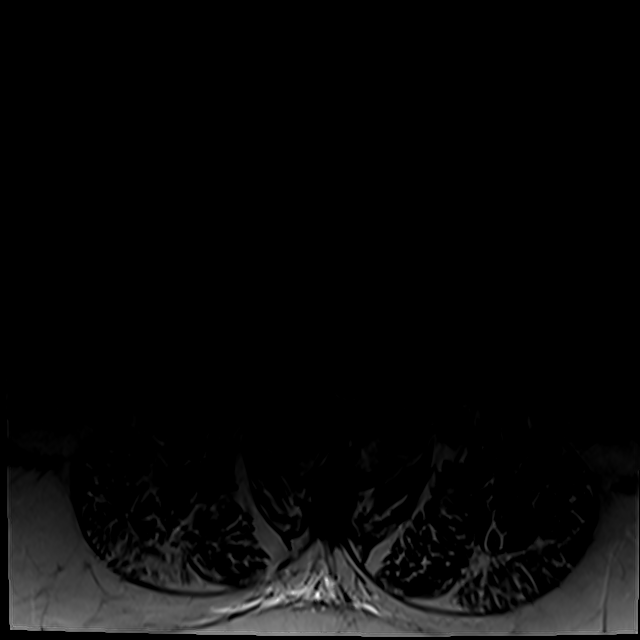
[im 19/37]
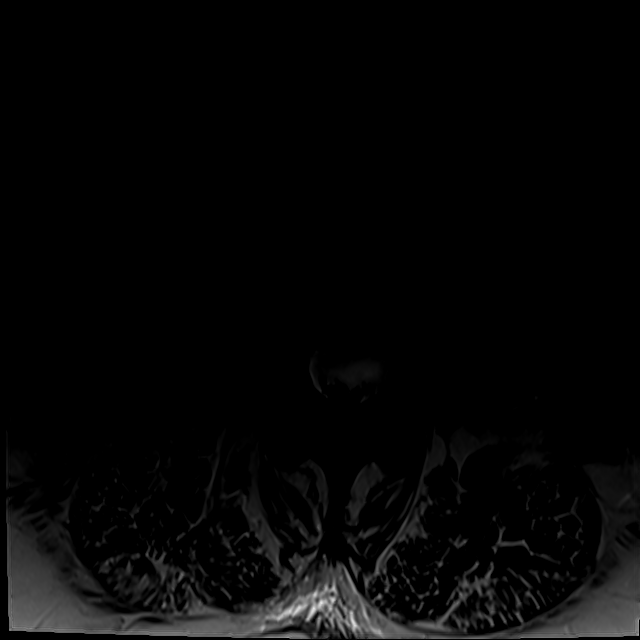
[im 22/37]
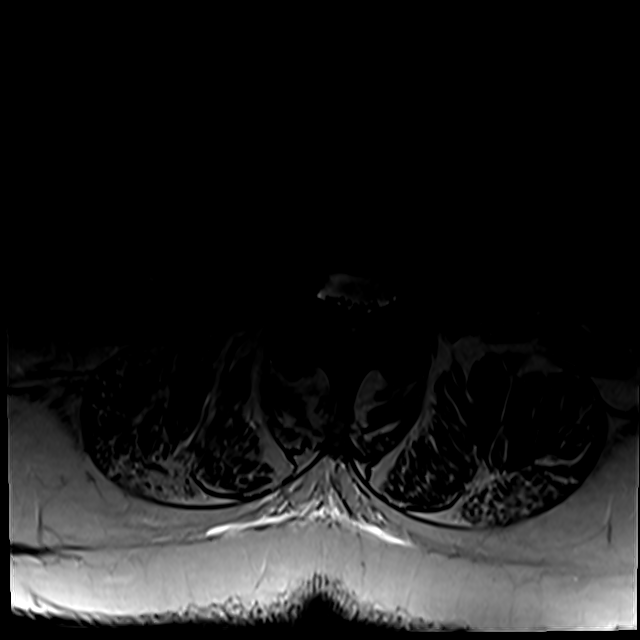
[im 25/37]
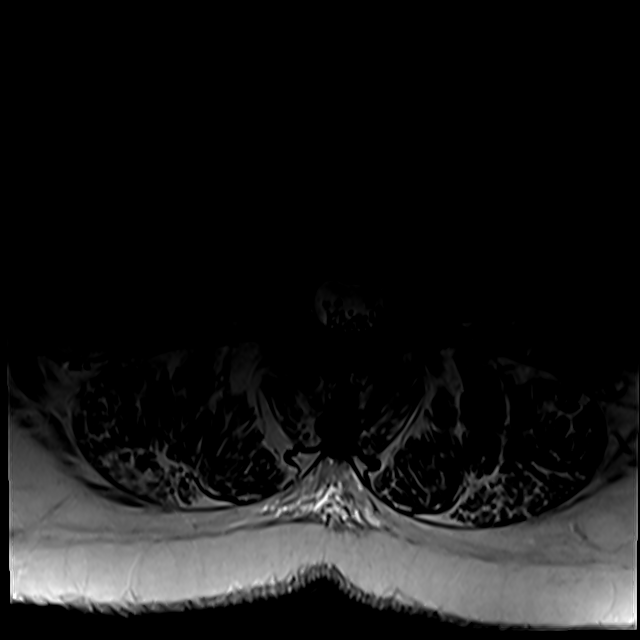
[im 31/37]
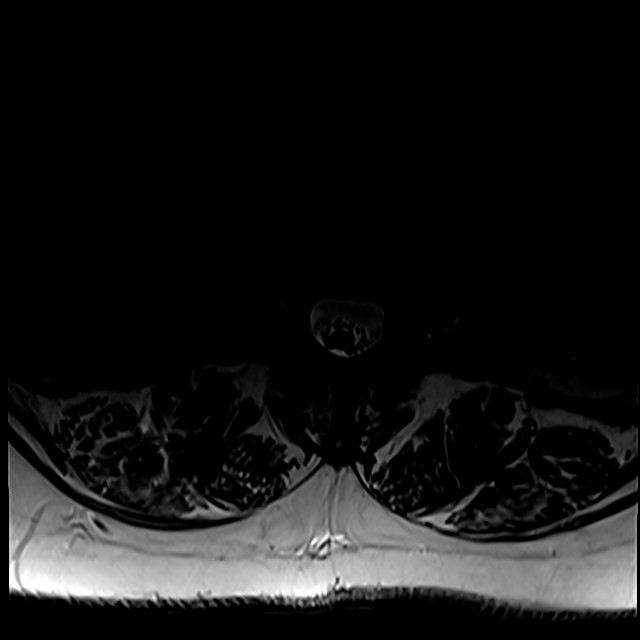
[im 37/37]
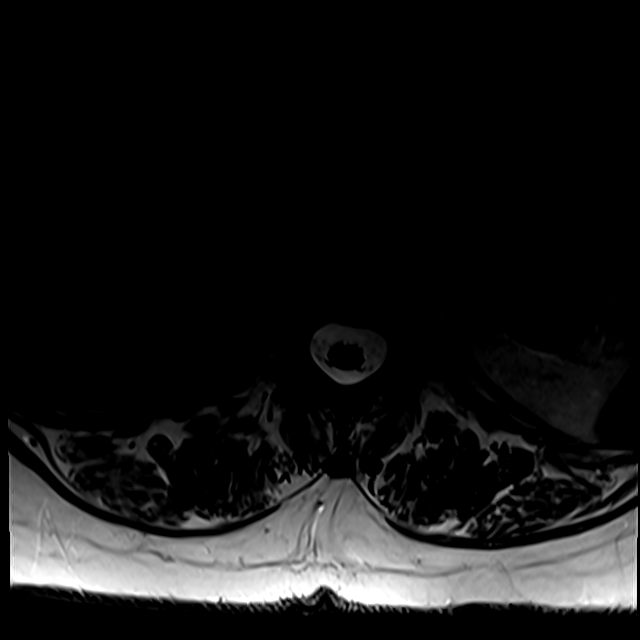

[Series 16: T2 · coronal · 5.0mm · 0.73mm/px · 3 of 19 slices shown (3 of 3)]
[im 4/19]
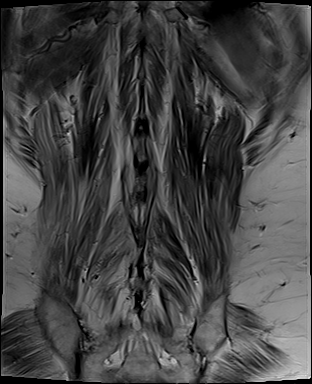
[im 10/19]
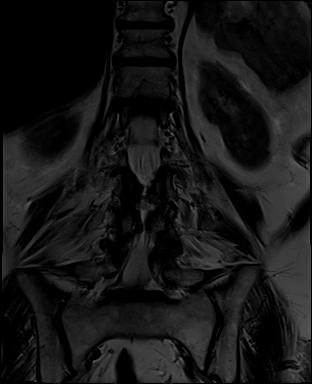
[im 16/19]
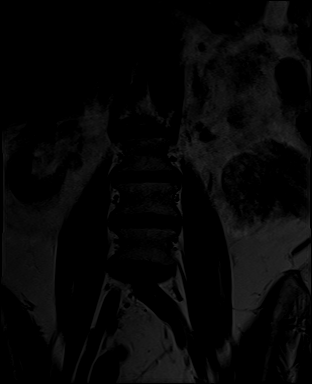

[20 of 48 positions shown; findings below may reference images not displayed]

FINDINGS: Segmentation: Degenerative anterolisthesis at L3-4 and below, grade
1.

Alignment:  Degenerative anterolisthesis at L3-4 and below, grade 1.

Vertebrae: No fracture, evidence of discitis, or bone lesion. Mild
marrow edema associated with the degenerated left L4-5 facet.

Conus medullaris and cauda equina: Conus extends to the L1-2 level.
Conus and cauda equina appear normal.

Paraspinal and other soft tissues: Fatty atrophic infiltration of
intrinsic back muscles

Disc levels:

T12- L1: Small left paracentral protrusion.  No neural impingement

L1-L2: Disc narrowing and bulging. Degenerative facet spurring on
both sides

L2-L3: Disc narrowing and bulging. Degenerative facet spurring on
both sides.

L3-L4: Degenerative facet spurring which is moderate to bulky. Mild
anterolisthesis. The disc is bulging with mild spinal stenosis.

L4-L5: Bulky degenerative facet spurring with anterolisthesis. The
disc is narrowed and bulging. No neural compression.

L5-S1:Degenerative facet spurring with anterolisthesis. Bulging of
the disc. Evidence of prior right-sided laminotomy. Mild left
foraminal narrowing. No neural compression.
IMPRESSION: 1. Generalized lumbar spine degeneration especially affecting the
facets at L3-4 and below, where there is anterolisthesis. Active
facet arthritis is seen on the left at L4-5.
2. Diffusely patent canal and foramina.

## 2022-10-22 ENCOUNTER — Other Ambulatory Visit: Payer: Self-pay | Admitting: Psychiatry
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
## 2022-10-24 ENCOUNTER — Encounter: Payer: Self-pay | Admitting: Psychiatry

## 2022-10-24 ENCOUNTER — Telehealth (INDEPENDENT_AMBULATORY_CARE_PROVIDER_SITE_OTHER): Payer: Medicare HMO | Admitting: Psychiatry

## 2022-10-24 DIAGNOSIS — F330 Major depressive disorder, recurrent, mild: Secondary | ICD-10-CM | POA: Diagnosis not present

## 2022-10-24 DIAGNOSIS — G3184 Mild cognitive impairment, so stated: Secondary | ICD-10-CM

## 2022-10-24 DIAGNOSIS — F338 Other recurrent depressive disorders: Secondary | ICD-10-CM

## 2022-10-24 DIAGNOSIS — G4700 Insomnia, unspecified: Secondary | ICD-10-CM

## 2022-10-24 MED ORDER — BUPROPION HCL ER (XL) 150 MG PO TB24: 150.0000 mg | ORAL_TABLET | Freq: Every day | ORAL | 0 refills | Status: DC

## 2022-10-24 NOTE — Patient Instructions (Signed)
Continue duloxetine 120 mg daily Continue bupropion 150 mg daily  Continue quetiapine 25 mg at night  Start light therapy Next appointment-  11/30 at 1:40  Generally, the light box should: Emit full-spectrum light with either fluorescent or LED bulbs (fluorescent is usually recommended) Provide an exposure to 10,000 lux of light Produce as little UV light as possible  Typical recommendations include using the light box: Within the 30 mins to first hour of waking up in the morning, using for about 20 to 30 minutes About 16 to 24 inches (41 to 61 centimeters) from your face, but follow the manufacturer's instructions about distance With eyes open, but not looking directly at the light  Noted that Light boxes aren't regulated by the Food and Drug Administration (FDA) for SAD (seasonal affective disorder) treatment.

## 2022-10-28 DIAGNOSIS — N183 Chronic kidney disease, stage 3 unspecified: Secondary | ICD-10-CM | POA: Diagnosis not present

## 2022-10-28 DIAGNOSIS — R7303 Prediabetes: Secondary | ICD-10-CM | POA: Diagnosis not present

## 2022-10-28 DIAGNOSIS — Z6839 Body mass index (BMI) 39.0-39.9, adult: Secondary | ICD-10-CM | POA: Diagnosis not present

## 2022-10-28 DIAGNOSIS — I129 Hypertensive chronic kidney disease with stage 1 through stage 4 chronic kidney disease, or unspecified chronic kidney disease: Secondary | ICD-10-CM | POA: Diagnosis not present

## 2022-10-28 DIAGNOSIS — H6192 Disorder of left external ear, unspecified: Secondary | ICD-10-CM | POA: Diagnosis not present

## 2022-10-28 DIAGNOSIS — Z2821 Immunization not carried out because of patient refusal: Secondary | ICD-10-CM | POA: Diagnosis not present

## 2022-10-28 DIAGNOSIS — E785 Hyperlipidemia, unspecified: Secondary | ICD-10-CM | POA: Diagnosis not present

## 2022-10-28 DIAGNOSIS — F325 Major depressive disorder, single episode, in full remission: Secondary | ICD-10-CM | POA: Diagnosis not present
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
## 2022-11-20 ENCOUNTER — Telehealth (INDEPENDENT_AMBULATORY_CARE_PROVIDER_SITE_OTHER): Payer: Medicare HMO | Admitting: Psychiatry

## 2022-11-20 ENCOUNTER — Encounter: Payer: Self-pay | Admitting: Psychiatry

## 2022-11-20 DIAGNOSIS — F330 Major depressive disorder, recurrent, mild: Secondary | ICD-10-CM

## 2022-11-20 DIAGNOSIS — G4700 Insomnia, unspecified: Secondary | ICD-10-CM

## 2022-11-20 DIAGNOSIS — F338 Other recurrent depressive disorders: Secondary | ICD-10-CM | POA: Diagnosis not present

## 2022-11-20 MED ORDER — QUETIAPINE FUMARATE 25 MG PO TABS: 25.0000 mg | ORAL_TABLET | Freq: Every day | ORAL | 0 refills | Status: AC

## 2022-11-20 MED ORDER — BUPROPION HCL ER (XL) 150 MG PO TB24: 150.0000 mg | ORAL_TABLET | Freq: Every day | ORAL | 0 refills | Status: AC

## 2022-11-20 NOTE — Patient Instructions (Signed)
Continue duloxetine 120 mg daily Continue bupropion 150 mg daily  Continue quetiapine 25 mg at night

## 2023-01-08 ENCOUNTER — Other Ambulatory Visit: Payer: Self-pay | Admitting: Psychiatry

## 2023-01-26 ENCOUNTER — Other Ambulatory Visit: Payer: Self-pay | Admitting: Psychiatry

## 2023-02-23 ENCOUNTER — Other Ambulatory Visit: Payer: Self-pay | Admitting: Psychiatry

## 2023-04-08 ENCOUNTER — Other Ambulatory Visit: Payer: Self-pay | Admitting: Psychiatry
